# Patient Record
Sex: Female | Born: 1985 | Race: Black or African American | Hispanic: No | Marital: Single | Smoking: Former smoker
Health system: Southern US, Community
[De-identification: ages and names within clinical notes are randomized; demographics above are authoritative.]

## PROBLEM LIST (undated history)

## (undated) DIAGNOSIS — I10 Essential (primary) hypertension: Secondary | ICD-10-CM

## (undated) HISTORY — DX: Essential (primary) hypertension: I10

---
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
## 2004-01-24 ENCOUNTER — Emergency Department (HOSPITAL_COMMUNITY): Admission: EM | Admit: 2004-01-24 | Discharge: 2004-01-24 | Payer: Self-pay | Admitting: Emergency Medicine

## 2004-02-03 ENCOUNTER — Ambulatory Visit: Payer: Self-pay

## 2004-03-24 ENCOUNTER — Emergency Department (HOSPITAL_COMMUNITY): Admission: EM | Admit: 2004-03-24 | Discharge: 2004-03-24 | Payer: Self-pay | Admitting: Emergency Medicine

## 2004-04-17 ENCOUNTER — Encounter (INDEPENDENT_AMBULATORY_CARE_PROVIDER_SITE_OTHER): Payer: Self-pay | Admitting: *Deleted

## 2004-04-17 LAB — CONVERTED CEMR LAB

## 2004-05-12 ENCOUNTER — Ambulatory Visit: Payer: Self-pay | Admitting: Family Medicine

## 2004-09-09 ENCOUNTER — Ambulatory Visit: Payer: Self-pay | Admitting: Family Medicine

## 2004-12-17 ENCOUNTER — Ambulatory Visit: Payer: Self-pay | Admitting: Family Medicine

## 2005-05-11 ENCOUNTER — Ambulatory Visit: Payer: Self-pay | Admitting: Family Medicine

## 2006-03-16 DIAGNOSIS — E669 Obesity, unspecified: Secondary | ICD-10-CM | POA: Insufficient documentation

## 2006-03-16 DIAGNOSIS — L732 Hidradenitis suppurativa: Secondary | ICD-10-CM | POA: Insufficient documentation

## 2006-03-16 DIAGNOSIS — F17200 Nicotine dependence, unspecified, uncomplicated: Secondary | ICD-10-CM | POA: Insufficient documentation

## 2006-03-17 ENCOUNTER — Encounter (INDEPENDENT_AMBULATORY_CARE_PROVIDER_SITE_OTHER): Payer: Self-pay | Admitting: *Deleted

## 2006-08-08 IMAGING — CR DG FINGER INDEX 2+V*L*
1 series · 1 of 1 positions shown · non-contrast
Comparison: None.

CLINICAL DATA: Laceration to distal finger.
 LEFT SECOND FINGER:

[view not recorded]
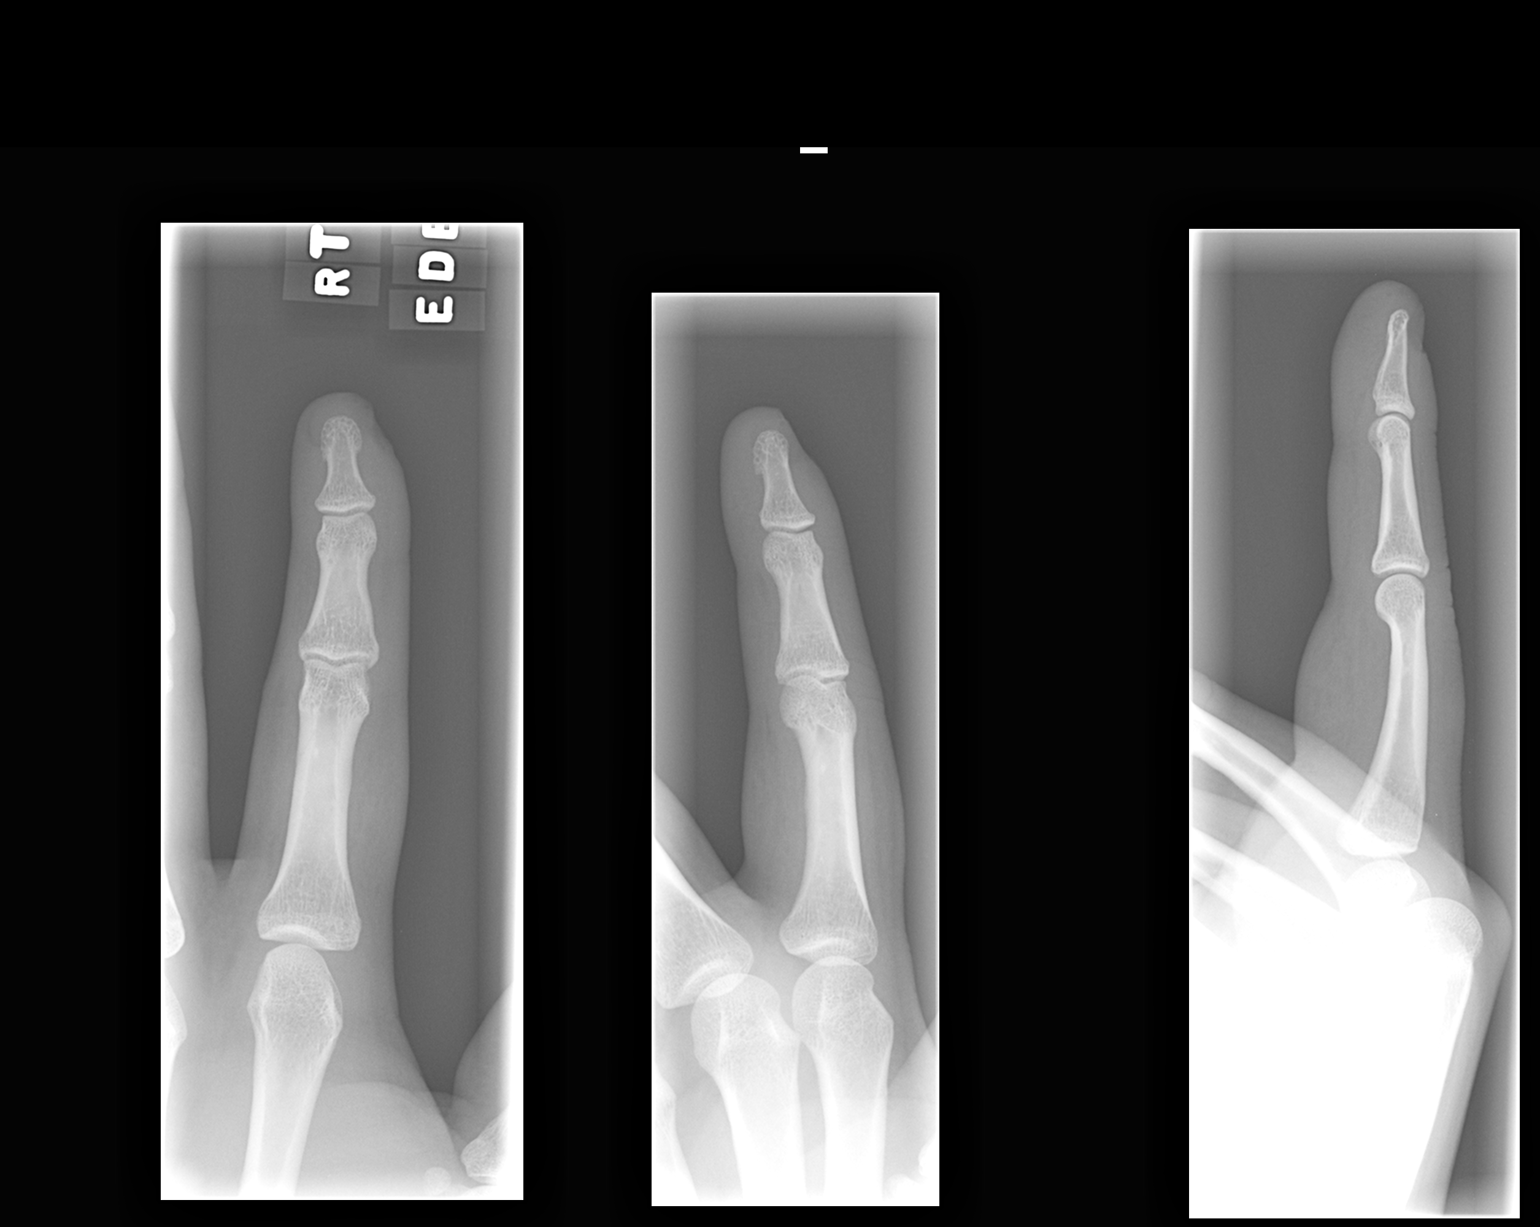

[1 of 1 positions shown; findings below may reference images not displayed]

Three view exam shows a soft tissue defect of the distal digit, but underlying bony structures intact.
IMPRESSION: Soft tissue defect ? bony structures intact.

## 2007-06-05 ENCOUNTER — Emergency Department (HOSPITAL_COMMUNITY): Admission: EM | Admit: 2007-06-05 | Discharge: 2007-06-05 | Payer: Self-pay | Admitting: Emergency Medicine

## 2010-06-01 ENCOUNTER — Encounter: Payer: Self-pay | Admitting: Family Medicine

## 2010-06-01 ENCOUNTER — Ambulatory Visit (INDEPENDENT_AMBULATORY_CARE_PROVIDER_SITE_OTHER): Payer: Self-pay | Admitting: Family Medicine

## 2010-06-01 VITALS — BP 144/95 | HR 75 | Temp 97.7°F | Ht 71.5 in | Wt 328.0 lb

## 2010-06-01 DIAGNOSIS — I10 Essential (primary) hypertension: Secondary | ICD-10-CM | POA: Insufficient documentation

## 2010-06-01 DIAGNOSIS — E669 Obesity, unspecified: Secondary | ICD-10-CM

## 2010-06-01 LAB — LDL CHOLESTEROL, DIRECT: Direct LDL: 69 mg/dL

## 2010-06-01 LAB — POCT GLYCOSYLATED HEMOGLOBIN (HGB A1C): Hemoglobin A1C: 5.9

## 2010-06-01 MED ORDER — HYDROCHLOROTHIAZIDE 25 MG PO TABS: 25.0000 mg | ORAL_TABLET | Freq: Every day | ORAL | Status: DC

## 2010-06-01 NOTE — Assessment & Plan Note (Addendum)
Body mass index is 45.11 kg/(m^2).This is deteriorated. Discussed principles of DASH diet and encouraged physical activity. Patient recently began paying attention to her diet. Hopeful for weight loss. Will screen for HLD, DM, TSH today.

## 2010-06-01 NOTE — Assessment & Plan Note (Signed)
Will check CBC, CMP today to evaluate for systemic causes. Pt started lifestyle modifications last week. Will continue to encourage DASH diet and regular exercise. Starting HCTZ. F/u in one week for BP check. Goal <140/90.

## 2010-06-01 NOTE — Progress Notes (Signed)
  Subjective:    Patient ID: Nancy Jarvis, female    DOB: January 24, 1985, 25 y.o.   MRN: [id number]  HPI 1. Blood pressure. Problem is self-identified. Has been feeling tired, malaise, headaches recently. Family told her to check BP and discovered to be high in 140s/90s consistently. Has been trying to cut down on her eating, not much success with weight so far. Mother and father both have HTN.   2. Hx of MRSA. Had boils previously and treated with antibiotics. Pt is worried about being tested for this; however, no boil or infected skin currently.  3. Health maintenance. Behind on pap screening. Has not seen a doctor in many years.   Review of Systems See HPI. Endorses headaches, seeing spots at times, fatigue. Denies abnormal menstrual bleeding, change in urination.     Objective:   Physical Exam  Vitals reviewed. Constitutional: She is oriented to person, place, and time. She appears well-developed and well-nourished. No distress.  HENT:  Head: Normocephalic and atraumatic.  Eyes: Conjunctivae and EOM are normal. Pupils are equal, round, and reactive to light.       Fundoscopic exam negative  Cardiovascular: Normal rate, regular rhythm, normal heart sounds and intact distal pulses.   No murmur heard. Pulmonary/Chest: Effort normal and breath sounds normal. No respiratory distress.  Musculoskeletal: She exhibits no edema.  Neurological: She is alert and oriented to person, place, and time.  Skin: No rash noted.  Psychiatric: She has a normal mood and affect.          Assessment & Plan:

## 2010-06-01 NOTE — Patient Instructions (Addendum)
Nice to meet you. Look up the DASH diet. I will call you if your labs are abnormal. Please call to schedule a BP check with nurse in next one week. You may schedule a visit for Pap at your convenience.  Hypertension (High Blood Pressure) As your heart beats, it forces blood through your arteries. This force is your blood pressure. If the pressure is too high, it is called hypertension (HTN) or high blood pressure. HTN is dangerous because you may have it and not know it. High blood pressure may mean that your heart has to work harder to pump blood. Your arteries may be narrow or stiff. The extra work puts you at risk for heart disease, stroke, and other problems.  Blood pressure consists of two numbers, a higher number over a lower, 110/72, for example. It is stated as "110 over 72." The ideal is below 120 for the top number (systolic) and under 80 for the bottom (diastolic). Write down your blood pressure today. You should pay close attention to your blood pressure if you have certain conditions such as:  Heart failure.  Prior heart attack.   Diabetes   Chronic kidney disease.   Prior stroke.   Multiple risk factors for heart disease.   To see if you have HTN, your blood pressure should be measured while you are seated with your arm held at the level of the heart. It should be measured at least twice. A one-time elevated blood pressure reading (especially in the Emergency Department) does not mean that you need treatment. There may be conditions in which the blood pressure is different between your right and left arms. It is important to see your caregiver soon for a recheck. Most people have essential hypertension which means that there is not a specific cause. This type of high blood pressure may be lowered by changing lifestyle factors such as:  Stress.  Smoking.   Lack of exercise.   Excessive weight.  Drug/tobacco/alcohol use.   Eating less salt.   Most people do not have  symptoms from high blood pressure until it has caused damage to the body. Effective treatment can often prevent, delay or reduce that damage. TREATMENT Treatment for high blood pressure, when a cause has been identified, is directed at the cause. There are a large number of medications to treat HTN. These fall into several categories, and your caregiver will help you select the medicines that are best for you. Medications may have side effects. You should review side effects with your caregiver. If your blood pressure stays high after you have made lifestyle changes or started on medicines,   Your medication(s) may need to be changed.   Other problems may need to be addressed.   Be certain you understand your prescriptions, and know how and when to take your medicine.   Be sure to follow up with your caregiver within the time frame advised (usually within two weeks) to have your blood pressure rechecked and to review your medications.   If you are taking more than one medicine to lower your blood pressure, make sure you know how and at what times they should be taken. Taking two medicines at the same time can result in blood pressure that is too low.  SEEK IMMEDIATE MEDICAL CARE IF YOU DEVELOP:  A severe headache, blurred or changing vision, or confusion.   Unusual weakness or numbness, or a faint feeling.   Severe chest or abdominal pain, vomiting, or breathing problems.  MAKE SURE YOU:   Understand these instructions.   Will watch your condition.   Will get help right away if you are not doing well or get worse.  Document Released: 01/03/2005 Document Re-Released: 06/23/2009 [facility] Patient Information 2011 [place], [place].

## 2010-06-02 LAB — COMPREHENSIVE METABOLIC PANEL
ALT: 14 U/L (ref 0–35)
AST: 14 U/L (ref 0–37)
Albumin: 4.3 g/dL (ref 3.5–5.2)
BUN: 11 mg/dL (ref 6–23)
Calcium: 9.5 mg/dL (ref 8.4–10.5)
Chloride: 105 mEq/L (ref 96–112)
Potassium: 4.4 mEq/L (ref 3.5–5.3)

## 2010-06-02 LAB — CBC
HCT: 45.2 % (ref 36.0–46.0)
Hemoglobin: 14.4 g/dL (ref 12.0–15.0)
MCV: 93.4 fL (ref 78.0–100.0)
WBC: 7.7 10*3/uL (ref 4.0–10.5)

## 2010-06-10 ENCOUNTER — Encounter: Payer: Self-pay | Admitting: Family Medicine

## 2010-07-02 ENCOUNTER — Encounter: Payer: Self-pay | Admitting: Family Medicine

## 2011-02-17 ENCOUNTER — Ambulatory Visit (INDEPENDENT_AMBULATORY_CARE_PROVIDER_SITE_OTHER): Payer: Self-pay | Admitting: Family Medicine

## 2011-02-17 ENCOUNTER — Ambulatory Visit: Payer: Self-pay

## 2011-02-17 ENCOUNTER — Encounter: Payer: Self-pay | Admitting: Family Medicine

## 2011-02-17 DIAGNOSIS — M25579 Pain in unspecified ankle and joints of unspecified foot: Secondary | ICD-10-CM

## 2011-02-17 DIAGNOSIS — I10 Essential (primary) hypertension: Secondary | ICD-10-CM

## 2011-02-17 DIAGNOSIS — M25572 Pain in left ankle and joints of left foot: Secondary | ICD-10-CM

## 2011-02-17 MED ORDER — MELOXICAM 15 MG PO TABS: 15.0000 mg | ORAL_TABLET | Freq: Every day | ORAL | Status: DC

## 2011-02-17 NOTE — Patient Instructions (Signed)
Make appointment sports medicine for ankle pain
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
## 2011-02-17 NOTE — Assessment & Plan Note (Signed)
Chronic left lateral ankle pain, likely strain vs tendinopathy.  Has not improved with conservative tx in 2 month.  Will refer to sports med for further evaluation.

## 2011-02-17 NOTE — Assessment & Plan Note (Signed)
Advised to restart HCTZ

## 2011-02-22 ENCOUNTER — Ambulatory Visit (INDEPENDENT_AMBULATORY_CARE_PROVIDER_SITE_OTHER): Payer: Self-pay | Admitting: Sports Medicine

## 2011-02-22 ENCOUNTER — Encounter: Payer: Self-pay | Admitting: Sports Medicine

## 2011-02-22 VITALS — BP 125/90 | HR 87 | Ht 71.0 in | Wt 320.0 lb

## 2011-02-22 DIAGNOSIS — M25579 Pain in unspecified ankle and joints of unspecified foot: Secondary | ICD-10-CM

## 2011-02-22 DIAGNOSIS — M7750 Other enthesopathy of unspecified foot: Secondary | ICD-10-CM

## 2011-02-22 MED ORDER — MELOXICAM 15 MG PO TABS: ORAL_TABLET | ORAL | Status: DC
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
## 2011-02-22 NOTE — Patient Instructions (Signed)
Great to see you Nancy Jarvis,  See you in 4 weeks.   Nancy Jarvis. Nancy Jarvis, M.D. [facility] [address], [place] [address] [phone]

## 2011-02-22 NOTE — Assessment & Plan Note (Signed)
Meloxicam. Sports insoles. Rehab. Supportive footwear. RTC 4 weeks.

## 2011-03-22 ENCOUNTER — Ambulatory Visit (INDEPENDENT_AMBULATORY_CARE_PROVIDER_SITE_OTHER): Payer: Self-pay | Admitting: Sports Medicine

## 2011-03-22 VITALS — BP 130/84

## 2011-03-22 DIAGNOSIS — M25572 Pain in left ankle and joints of left foot: Secondary | ICD-10-CM

## 2011-03-22 DIAGNOSIS — M25579 Pain in unspecified ankle and joints of unspecified foot: Secondary | ICD-10-CM

## 2011-03-22 NOTE — Assessment & Plan Note (Signed)
Initially suspected STS. Now appears more peroneal tendonitis. US guided injection as above. Cont meloxicam and ankle rehab.  RTC 3 weeks.

## 2011-03-22 NOTE — Patient Instructions (Signed)
Pt seen in office today, please excuse from any absences today. Nancy Jarvis. Benjamin Stain, M.D.

## 2011-03-22 NOTE — Progress Notes (Signed)
  Subjective:    Patient ID: Nancy Jarvis, female    DOB: [birth year], 26 y.o.   MRN: [id number]  HPI I saw this patient a few weeks ago for left ankle pain. At that point I gave her a presumptive diagnosis of sinus tarsi syndrome. 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 She localizes her pain behind the lateral malleolus, around the malleolus, and into the dorsolateral foot. Pain is worse with ambulating, and radiates as above.   Review of Systems    No fevers, chills, night sweats, weight loss, chest pain, or shortness of breath.  Social History: Non-smoker. Objective:   Physical Exam General:  Well developed, well nourished, and in no acute distress. Neuro:  Alert and oriented x3, extra-ocular muscles intact. Skin: Warm and dry, no rashes noted. Respiratory:  Not using accessory muscles, speaking in full sentences. Musculoskeletal: Ankle: No visible erythema or swelling. Range of motion is full in all directions. Strength is 5/5 in all directions. Stable lateral and medial ligaments; squeeze test and kleiger test unremarkable; Talar dome nontender; No pain at base of 5th MT; No tenderness over cuboid; No tenderness over N spot or navicular prominence Tender to palpation around the posterior and inferior aspects of the lateral malleolus. Some fullness noted over the peroneal tendons. Pain with resisted eversion. No sign of peroneal tendon subluxations or tenderness to palpation Negative tarsal tunnel tinel's Able to walk 4 steps.  Real-time Ultrasound Guided Injection of: Left peroneal tendon sheath Ultrasound guided injection is preferred based studies that show increased duration, increased effect, greater accuracy, decreased procedural pain, increased response rate, and decreased cost with ultrasound guided versus blind  injection. Consent obtained. Time-out conducted. Noted no overlying erythema, induration, or other signs of local infection. Skin prepped in a sterile fashion. Local anesthesia: Topical ethyl chloride. With sterile technique and under real time ultrasound guidance: Needle inserted into sheath, 1 cc lidocaine, 1 cc Depo-Medrol injected into sheath. Completed without difficulty Pain immediately resolved suggesting accurate placement of the medication. Advised to call if fevers/chills, erythema, induration, drainage, or persistent bleeding. Images saved.     Assessment & Plan:

## 2011-03-31 ENCOUNTER — Encounter: Payer: Self-pay | Admitting: *Deleted

## 2011-03-31 ENCOUNTER — Telehealth: Payer: Self-pay | Admitting: *Deleted

## 2011-03-31 NOTE — Telephone Encounter (Signed)
Message copied by Mora Bellman on Thu Mar 31, 2011  9:01 AM ------      Message from: Monica Becton      Created: Wed Mar 30, 2011  7:44 PM      Regarding: RE: work note?      Contact: [phone]       Hi Kadir Azucena!            Yah I think that'd be fine for her note.  She doing any better s/p injection? CU tomo!      -T            ----- Message -----         From: Mora Bellman, RN         Sent: 03/30/2011   2:37 PM           To: Monica Becton, MD      Subject: Annell Greening: work note?                                           Is it ok to do this note?      ----- Message -----         From: Lizbeth Bark         Sent: 03/30/2011  12:14 PM           To: Mora Bellman, RN      Subject: work note?                                               Pt is stating that she needs a note for work saying that she can't be on her feet more than 5 hours.  Any questions call her before 5 pm. [phone]

## 2011-03-31 NOTE — Telephone Encounter (Signed)
Work note completed and faxed.  Pt notified.

## 2011-04-12 ENCOUNTER — Encounter: Payer: Self-pay | Admitting: Family Medicine

## 2011-04-19 ENCOUNTER — Ambulatory Visit (INDEPENDENT_AMBULATORY_CARE_PROVIDER_SITE_OTHER): Payer: Self-pay | Admitting: Sports Medicine

## 2011-04-19 VITALS — BP 120/80

## 2011-04-19 DIAGNOSIS — M25579 Pain in unspecified ankle and joints of unspecified foot: Secondary | ICD-10-CM

## 2011-04-19 DIAGNOSIS — M25572 Pain in left ankle and joints of left foot: Secondary | ICD-10-CM

## 2011-04-19 NOTE — Patient Instructions (Signed)
MRI ankle. Aircast. Work cut back to 3h.  Return to go over MRI results.  Dr. Karie Schwalbe.

## 2011-04-19 NOTE — Progress Notes (Signed)
  Subjective:    Patient ID: Nancy Jarvis, female    DOB: 1985/11/18, 26 y.o.   MRN: [id number]  HPI Nancy Jarvis comes back for followup of her left ankle pain, and carries a diagnosis of peroneal tendinopathy. 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 I put her through oral NSAIDs, rehabilitation exercises, correct her biomechanics, and have performed a peroneal tendon ultrasound guided injection at the last visit.overall she is only about 30% better, she notes decreased swelling, but still has pain. I cut back her work day to a maximum 5 hours on her feet, however she notes that she still gets pain when she works on her feet more than 3 hours.  Review of Systems    No fevers, chills, night sweats, weight loss, chest pain, or shortness of breath.  Social History: Non-smoker. Objective:   Physical Exam General:  Well developed, well nourished, and in no acute distress. Neuro:  Alert and oriented x3, extra-ocular muscles intact. Skin: Warm and dry, no rashes noted. Respiratory:  Not using accessory muscles, speaking in full sentences. Musculoskeletal: Left Ankle: No visible erythema or swelling. Range of motion is full in all directions. Strength is 5/5 in all directions. Stable lateral and medial ligaments; squeeze test and kleiger test unremarkable; Talar dome nontender; No pain at base of 5th MT; No tenderness over cuboid; No tenderness over N spot or navicular prominence Tender to palpation behind the lateral malleolus, and minimal pain with resisted eversion. Negative tarsal tunnel tinel's Able to walk 4 steps.       Assessment & Plan:

## 2011-04-19 NOTE — Assessment & Plan Note (Signed)
Better with injection. Not sufficiently better s/p correction of biomechanics, rehab, NSAIDS, injection. MRI R ankle. RTC to discuss results.

## 2011-04-25 ENCOUNTER — Encounter: Payer: Self-pay | Admitting: Family Medicine

## 2011-04-25 ENCOUNTER — Other Ambulatory Visit (HOSPITAL_COMMUNITY)
Admission: RE | Admit: 2011-04-25 | Discharge: 2011-04-25 | Disposition: A | Discharge status: Home / Self Care | Payer: Self-pay | Source: Ambulatory Visit | Attending: Family Medicine | Admitting: Family Medicine

## 2011-04-25 ENCOUNTER — Ambulatory Visit (INDEPENDENT_AMBULATORY_CARE_PROVIDER_SITE_OTHER): Payer: Self-pay | Admitting: Family Medicine

## 2011-04-25 VITALS — BP 131/93 | HR 70 | Temp 97.8°F | Ht 71.0 in | Wt 324.7 lb

## 2011-04-25 DIAGNOSIS — Z113 Encounter for screening for infections with a predominantly sexual mode of transmission: Secondary | ICD-10-CM | POA: Insufficient documentation

## 2011-04-25 DIAGNOSIS — I10 Essential (primary) hypertension: Secondary | ICD-10-CM

## 2011-04-25 DIAGNOSIS — Z124 Encounter for screening for malignant neoplasm of cervix: Secondary | ICD-10-CM

## 2011-04-25 DIAGNOSIS — Z23 Encounter for immunization: Secondary | ICD-10-CM

## 2011-04-25 DIAGNOSIS — Z202 Contact with and (suspected) exposure to infections with a predominantly sexual mode of transmission: Secondary | ICD-10-CM

## 2011-04-25 DIAGNOSIS — Z01419 Encounter for gynecological examination (general) (routine) without abnormal findings: Secondary | ICD-10-CM | POA: Insufficient documentation

## 2011-04-25 DIAGNOSIS — R51 Headache: Secondary | ICD-10-CM

## 2011-04-25 DIAGNOSIS — E669 Obesity, unspecified: Secondary | ICD-10-CM

## 2011-04-25 DIAGNOSIS — F17200 Nicotine dependence, unspecified, uncomplicated: Secondary | ICD-10-CM

## 2011-04-25 MED ORDER — LABETALOL HCL 100 MG PO TABS: 100.0000 mg | ORAL_TABLET | Freq: Two times a day (BID) | ORAL | Status: DC

## 2011-04-25 NOTE — Patient Instructions (Signed)
Will start labetolol 100mg  twice daily-low dose. This med is safe if you ever get pregnant. Goal BP is <135/85. Make an appointment for nurse blood pressure check in 1-2 weeks. Will call with test results if abnormal, otherwise will get a letter. Will make nutrition referral-please call Dr. Gerilyn Pilgrim for appointment when ready to schedule. Will make dental referral-likely on the waiting list.  DASH Diet The DASH diet stands for "Dietary Approaches to Stop Hypertension." It is a healthy eating plan that has been shown to reduce high blood pressure (hypertension) in as little as 14 days, while also possibly providing other significant health benefits. These other health benefits include reducing the risk of breast cancer after menopause and reducing the risk of type 2 diabetes, heart disease, colon cancer, and stroke. Health benefits also include weight loss and slowing kidney failure in patients with chronic kidney disease.  DIET GUIDELINES  Limit salt (sodium). Your diet should contain less than 1500 mg of sodium daily.   Limit refined or processed carbohydrates. Your diet should include mostly whole grains. Desserts and added sugars should be used sparingly.   Include small amounts of heart-healthy fats. These types of fats include nuts, oils, and tub margarine. Limit saturated and trans fats. These fats have been shown to be harmful in the body.  CHOOSING FOODS  The following food groups are based on a 2000 calorie diet. See your Registered Dietitian for individual calorie needs. Grains and Grain Products (6 to 8 servings daily)  Eat More Often: Whole-wheat bread, brown rice, whole-grain or wheat pasta, quinoa, popcorn without added fat or salt (air popped).   Eat Less Often: White bread, white pasta, white rice, cornbread.  Vegetables (4 to 5 servings daily)  Eat More Often: Fresh, frozen, and canned vegetables. Vegetables may be raw, steamed, roasted, or grilled with a minimal amount of fat.    Eat Less Often/Avoid: Creamed or fried vegetables. Vegetables in a cheese sauce.  Fruit (4 to 5 servings daily)  Eat More Often: All fresh, canned (in natural juice), or frozen fruits. Dried fruits without added sugar. One hundred percent fruit juice ( cup [237 mL] daily).   Eat Less Often: Dried fruits with added sugar. Canned fruit in light or heavy syrup.  [employer], Fish, and Poultry (2 servings or less daily. One serving is 3 to 4 oz [85-114 g]).  Eat More Often: Ninety percent or leaner ground beef, tenderloin, sirloin. Round cuts of beef, chicken breast, [place] breast. All fish. Grill, bake, or broil your meat. Nothing should be fried.   Eat Less Often/Avoid: Fatty cuts of meat, [place], or chicken leg, thigh, or wing. Fried cuts of meat or fish.  Dairy (2 to 3 servings)  Eat More Often: Low-fat or fat-free milk, low-fat plain or light yogurt, reduced-fat or part-skim cheese.   Eat Less Often/Avoid: Milk (whole, 2%, skim, or chocolate).Whole milk yogurt. Full-fat cheeses.  Nuts, Seeds, and Legumes (4 to 5 servings per week)  Eat More Often: All without added salt.   Eat Less Often/Avoid: Salted nuts and seeds, canned beans with added salt.  Fats and Sweets (limited)  Eat More Often: Vegetable oils, tub margarines without trans fats, sugar-free gelatin. Mayonnaise and salad dressings.   Eat Less Often/Avoid: Coconut oils, palm oils, butter, stick margarine, cream, half and half, cookies, candy, pie.  FOR MORE INFORMATION The Dash Diet Eating Plan: www.dashdiet.org Document Released: 12/23/2010 Document Reviewed: 12/13/2010 [facility] Patient Information 2012 [place], [place].

## 2011-04-26 DIAGNOSIS — R519 Headache, unspecified: Secondary | ICD-10-CM | POA: Insufficient documentation

## 2011-04-26 DIAGNOSIS — R51 Headache: Secondary | ICD-10-CM | POA: Insufficient documentation

## 2011-04-26 LAB — HIV ANTIBODY (ROUTINE TESTING W REFLEX): HIV: NONREACTIVE

## 2011-04-26 NOTE — Assessment & Plan Note (Addendum)
Diastolic above goal. Will start second agent labetolol at 100mg  BID, as patient may began pregnant not using contraception will avoid ACEi/ARBs. Follow up in 1-2 weeks for nurse visit. Nutrition referral. Patient with orange card now, will consider referral for sleep study if available to rule out secondary cause of OSA.

## 2011-04-26 NOTE — Assessment & Plan Note (Signed)
Patient attributes to HTN, but I am doubtful. Will follow up symptoms once BP controlled. More likely this is migraine disorder or sleep apnea induced.

## 2011-04-26 NOTE — Progress Notes (Signed)
  Subjective:    Patient ID: Nancy Jarvis, female    DOB: 09-28-1985, 28 y.o.   MRN: [id number]  HPI CPP today.  1. HTN. Taking HCTZ most days. No side effects. Still has headaches sometimes she attributes to HTN, but does not check at home. Denies edema, chest pain, syncope, urinary changes.  2. Tobacco abuse. Has decreased cigarette consumption to few daily. Is attempting cessation without assistance.   3. HM. No contraception currently. Is considering becoming pregnant, has a monogamous relationship currently. Refuses contraception as she would be agreeable to pregnancy currently. Regular menstrual cycles.   4. Tooth pain. Has previous crowns and now a right molar is chipped causing worsening pain. No fever, edema, redness, pus. Requests dental referral.  5. Still having ankle pain-followed by Dr. Karie Schwalbe in sports med clinic, awaiting MRI results. Takes mobic irregularly. Requests stronger pain meds as she is limited standing at work.  Review of Systems See HPI otherwise negative.    Objective:   Physical Exam  Vitals reviewed. Constitutional: She is oriented to person, place, and time. She appears well-developed and well-nourished. No distress.  HENT:  Head: Normocephalic and atraumatic.       Right lower tooth with amalgum crown, chipped and disintegrated tooth noted. No abscess, erythema, pus noted. No LAD.  Eyes: EOM are normal. Pupils are equal, round, and reactive to light.  Neck: Neck supple. No thyromegaly present.  Cardiovascular: Normal rate, regular rhythm, normal heart sounds and intact distal pulses.   No murmur heard. Pulmonary/Chest: Effort normal and breath sounds normal. No respiratory distress. She has no wheezes. She has no rales.  Abdominal: Soft.  Genitourinary: Vagina normal and uterus normal. No vaginal discharge found.  Musculoskeletal: She exhibits no edema and no tenderness.  Neurological: She is alert and oriented to person, place, and time. No cranial  nerve deficit. She exhibits normal muscle tone. Coordination normal.  Skin: No rash noted. She is not diaphoretic.  Psychiatric: She has a normal mood and affect.       Assessment & Plan:

## 2011-04-26 NOTE — Assessment & Plan Note (Signed)
Patient not interested in cessation materials. Emphasized importance, follow up prn.

## 2011-04-26 NOTE — Assessment & Plan Note (Signed)
Weight stable over past year. Discussed dietary changes. Nutrition referral. Follow up in 2-3 months.

## 2011-04-28 ENCOUNTER — Encounter: Payer: Self-pay | Admitting: Family Medicine

## 2011-05-02 ENCOUNTER — Telehealth: Payer: Self-pay | Admitting: Family Medicine

## 2011-05-02 ENCOUNTER — Other Ambulatory Visit: Payer: Self-pay | Admitting: Family Medicine

## 2011-05-02 DIAGNOSIS — M25579 Pain in unspecified ankle and joints of unspecified foot: Secondary | ICD-10-CM

## 2011-05-02 MED ORDER — TRAMADOL HCL 50 MG PO TABS: 50.0000 mg | ORAL_TABLET | Freq: Four times a day (QID) | ORAL | Status: AC | PRN

## 2011-05-02 MED ORDER — MELOXICAM 15 MG PO TABS: ORAL_TABLET | ORAL | Status: AC

## 2011-05-02 NOTE — Telephone Encounter (Signed)
Patient advised to call  Outpatient Pharmacy and ask them to contact [facility] to have rx transferred over.

## 2011-05-02 NOTE — Telephone Encounter (Signed)
Reviewed [facility] notes-tendinopathy is working diagnosis pending MRI results. Patient has requested narcotics but we already discussed me not prescribing opiates for chronic ankle pain. I will refill her NSAID prn and we may try tramadol for short course. F/u at [facility]. Please inform patient she may pick up tramadol at the pharmacy.

## 2011-05-02 NOTE — Telephone Encounter (Signed)
Patient needs the Rx for Ultram and Mobic to be sent to [facility] and she needs the [facility] to be her new pharmacy and [facility] to be taken out.

## 2011-05-02 NOTE — Telephone Encounter (Signed)
Pt is stating that she is in need of pain meds because of her being on her feet all day.  She had talked with Dr T and he said for her to call to tell her doctor that she needs this and she can't see him until 4/30.  He said that if she has any questions, to call [facility] to talk to nurse there. She needs this asap.   [facility]

## 2011-05-02 NOTE — Telephone Encounter (Signed)
Message left on voicemail that rx has been sent to pharmacy. Follow up at [facility].

## 2011-05-02 NOTE — Telephone Encounter (Signed)
Will send message to Dr. Cristal Ford.   Called and spoke with mother .  Patient can't take calls at work.  Advised will call back when MD has responded. Patient's cell # [phone].

## 2011-05-04 ENCOUNTER — Telehealth: Payer: Self-pay | Admitting: Family Medicine

## 2011-05-04 NOTE — Telephone Encounter (Addendum)
Want a referral to Dr. Alphonse Guild office with [facility].  Please call patient back with all appt details when completed

## 2011-05-04 NOTE — Telephone Encounter (Signed)
I have sent in tramadol to pharmacy. Patient did not answer my return call. I will be willing to send the tramadol prescription to a different pharmacy if needed. I do not see any recent note by Dr. Karie Schwalbe. Patient should be following up with sports med for ankle pain. Dr. Karie Schwalbe treats her ankle pain.

## 2011-05-04 NOTE — Telephone Encounter (Signed)
Can you help me complete this referral. For ankle/foot pain. Thanks.

## 2011-05-04 NOTE — Telephone Encounter (Signed)
Patient is calling because she saw Dr. Karie Schwalbe at [facility] Medicine and he asked for Dr. Cristal Ford to prescribe some pain medication.  The Mobic is not working for her.  She said that Dr. Karie Schwalbe said to call him if there is a problem.  She also wants a referral to Dr. Shana Chute Specialist.

## 2011-05-05 ENCOUNTER — Other Ambulatory Visit: Payer: Self-pay | Admitting: Family Medicine

## 2011-05-05 DIAGNOSIS — M25572 Pain in left ankle and joints of left foot: Secondary | ICD-10-CM

## 2011-05-05 NOTE — Telephone Encounter (Signed)
LVM for patient to call back to get some info on the podiatry referral she is requesting. She is wanting to be referral to Dr.Regal with the [facility]. I don't see and insurance other then the orange card on file and was wanting to know if she is still wanting to be referred there she will have to pay $200 dollars up front to be seen and then she will be responsible for the rest of price. If not we will have to send through [facility].

## 2011-05-05 NOTE — Telephone Encounter (Signed)
Pt called to speak with you. ?regarding referral for foot doctor.  Please call back to give status on this

## 2011-05-05 NOTE — Telephone Encounter (Signed)
Spoke with patient and gave her the information from below. She would like for referral to go through [facility] being that money is not available at this time. I will faxed this off today

## 2011-05-17 ENCOUNTER — Ambulatory Visit (INDEPENDENT_AMBULATORY_CARE_PROVIDER_SITE_OTHER): Payer: Self-pay | Admitting: Sports Medicine

## 2011-05-17 VITALS — BP 124/89

## 2011-05-17 DIAGNOSIS — M25572 Pain in left ankle and joints of left foot: Secondary | ICD-10-CM

## 2011-05-17 DIAGNOSIS — M25579 Pain in unspecified ankle and joints of unspecified foot: Secondary | ICD-10-CM

## 2011-05-17 NOTE — Progress Notes (Signed)
Patient ID: Nancy Jarvis, female   DOB: 10-18-1985, 26 y.o.   MRN: [id number]  Patient here for MRI results, however MRI has not yet been done. She will reschedule after MRI is done. No charge.

## 2011-05-18 ENCOUNTER — Encounter: Payer: Self-pay | Admitting: *Deleted

## 2011-05-18 ENCOUNTER — Ambulatory Visit (HOSPITAL_COMMUNITY)
Admission: RE | Admit: 2011-05-18 | Discharge: 2011-05-18 | Disposition: A | Discharge status: Home / Self Care | Payer: Self-pay | Source: Ambulatory Visit | Attending: Sports Medicine | Admitting: Sports Medicine

## 2011-05-18 DIAGNOSIS — M76829 Posterior tibial tendinitis, unspecified leg: Secondary | ICD-10-CM | POA: Insufficient documentation

## 2011-05-24 ENCOUNTER — Encounter: Payer: Self-pay | Admitting: Sports Medicine

## 2011-05-24 ENCOUNTER — Ambulatory Visit (INDEPENDENT_AMBULATORY_CARE_PROVIDER_SITE_OTHER): Payer: Self-pay | Admitting: Sports Medicine

## 2011-05-24 VITALS — BP 131/84 | HR 92

## 2011-05-24 DIAGNOSIS — M25572 Pain in left ankle and joints of left foot: Secondary | ICD-10-CM

## 2011-05-24 DIAGNOSIS — M25579 Pain in unspecified ankle and joints of unspecified foot: Secondary | ICD-10-CM

## 2011-05-24 NOTE — Assessment & Plan Note (Addendum)
US Guided injection of tib post sheath. Airsport brace, for immobilization. OOW for 2 weeks. Back to theraband exercises. RTC 4 weeks.

## 2011-05-24 NOTE — Progress Notes (Signed)
  Subjective:    Patient ID: Nancy Jarvis, female    DOB: 08-19-1985, 26 y.o.   MRN: [id number]  HPI Nancy Jarvis comes back for followup of her left ankle pain.  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 I put her through oral NSAIDs, rehabilitation exercises, correct her biomechanics, and have performed a peroneal tendon ultrasound guided injection.  Overall she was only about 30% better, she notes decreased swelling, but still has pain. I cut back her work day to a maximum 5 hours on her feet, however she notes that she still gets pain when she works on her feet more than 3 hours.  I recently obtain an MRI of her left ankle, which showed an accessory navicular, as well as tibialis posterior tendinitis.  Review of Systems    No fevers, chills, night sweats, weight loss, chest pain, or shortness of breath.  Social History: Non-smoker. Objective:   Physical Exam General:  Well developed, well nourished, and in no acute distress. Neuro:  Alert and oriented x3, extra-ocular muscles intact. Skin: Warm and dry, no rashes noted. Respiratory:  Not using accessory muscles, speaking in full sentences. Musculoskeletal: Tender to palpation along entirety of tibialis posterior tendon, as well as over the sinus tarsi. Pain with resisted inversion.  Real-time Ultrasound Guided Injection of: left tibialis posterior tendon sheath. Ultrasound guided injection is preferred based studies that show increased duration, increased effect, greater accuracy, decreased procedural pain, increased response rate, and decreased cost with ultrasound guided versus blind injection. Verbal informed consent obtained. Time-out conducted. Noted no overlying erythema, induration, or other signs of local infection. Skin prepped in a sterile fashion. Local anesthesia: Topical Ethyl chloride. With sterile technique and under real time ultrasound guidance: needle inserted into the tendon sheath, 1 cc  Depo-Medrol 40, 2 cc lidocaine injected into the sheath without resistance. Completed without difficulty Pain immediately resolved suggesting accurate placement of the medication. Advised to call if fevers/chills, erythema, induration, drainage, or persistent bleeding. Images saved.    Assessment & Plan:

## 2011-06-21 ENCOUNTER — Ambulatory Visit: Payer: Self-pay | Admitting: Sports Medicine

## 2011-07-12 ENCOUNTER — Ambulatory Visit (INDEPENDENT_AMBULATORY_CARE_PROVIDER_SITE_OTHER): Payer: Self-pay | Admitting: Sports Medicine

## 2011-07-12 ENCOUNTER — Other Ambulatory Visit: Payer: Self-pay | Admitting: Family Medicine

## 2011-07-12 VITALS — BP 131/87

## 2011-07-12 DIAGNOSIS — I10 Essential (primary) hypertension: Secondary | ICD-10-CM

## 2011-07-12 DIAGNOSIS — M25572 Pain in left ankle and joints of left foot: Secondary | ICD-10-CM

## 2011-07-12 DIAGNOSIS — M25579 Pain in unspecified ankle and joints of unspecified foot: Secondary | ICD-10-CM

## 2011-07-12 MED ORDER — LABETALOL HCL 100 MG PO TABS: 100.0000 mg | ORAL_TABLET | Freq: Two times a day (BID) | ORAL | Status: DC

## 2011-07-12 NOTE — Progress Notes (Signed)
Patient ID: Nancy Jarvis, female   DOB: 1985/05/14, 26 y.o.   MRN: [id number] Subjective:   [id number] left ankle pain.  HPI: Miho returns with her pain not much better in her left ankle. To recap, she presented approximately 4/2 months ago with symptoms suggestive of a peroneal tendinitis, she failed conservative treatment with rehabilitation and NSAIDs, and had an ultrasound guided injection. Subsequently she developed pain along the medial aspect of the ankle, symptoms suggestive of a tibialis posterior tendinitis. she again failed rehabilitation, and immobilization for this as well as anti-inflammatories,we follow this up with an MRI, that showed tibialis posterior tendinitis without any evidence of a navicular stress fracture, and had an ultrasound guided injection into the tibialis posterior tendon sheath. This provided her with approximately several days of benefit, however the pain returned.  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  Past medical history, surgical history, family history, social history, allergies, and medications reviewed from the medical record and no changes needed.  Review of Systems: No fevers, chills, night sweats, weight loss, chest pain, or shortness of breath.    Objective:  General:  Well Developed, well nourished, and in no acute distress. Neuro:  Alert and oriented x3, extra-ocular muscles intact. Skin: Warm and dry, no rashes noted. Respiratory:  Not using accessory muscles, speaking in full sentences. Musculoskeletal: Ankle: No visible erythema or swelling. Range of motion is full in all directions. Strength is 5/5 in all directions. Stable lateral and medial ligaments; squeeze test and kleiger test unremarkable; Talar dome nontender; No pain at base of 5th MT; No tenderness over cuboid; No tenderness over N spot or navicular prominence Tender to palpation along the posterior aspect of the medial malleolus, all the way to the navicular.  Pain with resisted inversion. No sign of peroneal tendon subluxations or tenderness to palpation Negative tarsal tunnel tinel's Able to walk 4 steps.  Good hindfoot varus with toe raises. Assessment & Plan:

## 2011-07-12 NOTE — Assessment & Plan Note (Addendum)
Refractory posterior tibialis tendonitis. Failed PT, NSAIDS, US guided injection, bracing. Referral to Orthopaedics for consideration of operative treatment. We'll start with [facility], but if unable to consider sending to [facility].

## 2011-08-23 ENCOUNTER — Other Ambulatory Visit: Payer: Self-pay | Admitting: Family Medicine

## 2011-08-23 DIAGNOSIS — I10 Essential (primary) hypertension: Secondary | ICD-10-CM

## 2011-08-23 MED ORDER — HYDROCHLOROTHIAZIDE 25 MG PO TABS: 25.0000 mg | ORAL_TABLET | Freq: Every day | ORAL | Status: DC

## 2011-10-04 ENCOUNTER — Ambulatory Visit: Payer: Self-pay | Admitting: Family Medicine

## 2011-10-05 ENCOUNTER — Encounter: Payer: Self-pay | Admitting: Family Medicine

## 2011-10-05 ENCOUNTER — Ambulatory Visit (INDEPENDENT_AMBULATORY_CARE_PROVIDER_SITE_OTHER): Payer: Self-pay | Admitting: Family Medicine

## 2011-10-05 VITALS — BP 123/85 | HR 72 | Temp 98.0°F | Ht 71.0 in | Wt 336.0 lb

## 2011-10-05 DIAGNOSIS — K029 Dental caries, unspecified: Secondary | ICD-10-CM | POA: Insufficient documentation

## 2011-10-05 NOTE — Progress Notes (Signed)
  Subjective:    Patient ID: Nancy Jarvis, female    DOB: 12-Apr-1985, 26 y.o.   MRN: [id number]  HPI  Aniyia comes in due to tooth pain and a hole in her tooth.  It is a bottom left molar, the second one from the back.  She has not seen a dentist in a while, but has had several fillings before.  She says there is some minor pain, but no puss, abscess, drainage.  She says she tries to be good about oral hygiene brushing teeth twice a day, flossing, using mouthwash.  No fevers/chills, nausea/vomiting.   Review of Systems See HPI    Objective:   Physical Exam BP 123/85  Pulse 72  Temp 98 F (36.7 C) (Oral)  Ht 5\' 11"  (1.803 m)  Wt 336 lb (152.409 kg)  BMI 46.86 kg/m2  LMP 09/28/2011 General appearance: alert, cooperative, no distress and morbidly obese Mouth: oral mucosa moist, no lesions.  Poor dentition with multiple cavity fillings, patient does appear to have some tooth decay around the filling in the left 2nd to last bottom molar, but there is no abscess or drainage.        Assessment & Plan:

## 2011-10-05 NOTE — Assessment & Plan Note (Signed)
Will refer to dentist, but did warn patient that it may be several months before she can get in with the orange card.  Also reviewed red flags (increased pain, abscess, drainage) for which to seek care.

## 2011-10-06 ENCOUNTER — Telehealth: Payer: Self-pay | Admitting: *Deleted

## 2011-10-06 NOTE — Telephone Encounter (Signed)
Referral to [facility] fax 10.1.13

## 2011-11-07 ENCOUNTER — Other Ambulatory Visit: Payer: Self-pay | Admitting: Family Medicine

## 2011-11-07 DIAGNOSIS — I10 Essential (primary) hypertension: Secondary | ICD-10-CM

## 2011-11-07 MED ORDER — HYDROCHLOROTHIAZIDE 25 MG PO TABS: 25.0000 mg | ORAL_TABLET | Freq: Every day | ORAL | Status: DC

## 2011-11-11 ENCOUNTER — Ambulatory Visit: Payer: Self-pay | Admitting: Family Medicine

## 2011-11-23 ENCOUNTER — Other Ambulatory Visit: Payer: Self-pay | Admitting: Family Medicine

## 2011-11-23 DIAGNOSIS — I10 Essential (primary) hypertension: Secondary | ICD-10-CM

## 2011-11-23 MED ORDER — LABETALOL HCL 100 MG PO TABS: 100.0000 mg | ORAL_TABLET | Freq: Two times a day (BID) | ORAL | Status: DC

## 2011-12-08 ENCOUNTER — Ambulatory Visit (INDEPENDENT_AMBULATORY_CARE_PROVIDER_SITE_OTHER): Payer: Self-pay | Admitting: Family Medicine

## 2011-12-08 ENCOUNTER — Encounter: Payer: Self-pay | Admitting: Family Medicine

## 2011-12-08 VITALS — BP 129/74 | HR 71 | Temp 97.8°F | Ht 71.0 in | Wt 315.3 lb

## 2011-12-08 DIAGNOSIS — F17200 Nicotine dependence, unspecified, uncomplicated: Secondary | ICD-10-CM

## 2011-12-08 DIAGNOSIS — I10 Essential (primary) hypertension: Secondary | ICD-10-CM

## 2011-12-08 DIAGNOSIS — E669 Obesity, unspecified: Secondary | ICD-10-CM

## 2011-12-08 DIAGNOSIS — M545 Low back pain: Secondary | ICD-10-CM | POA: Insufficient documentation

## 2011-12-08 LAB — CBC
MCH: 31.2 pg (ref 26.0–34.0)
MCHC: 34.2 g/dL (ref 30.0–36.0)
Platelets: 283 10*3/uL (ref 150–400)
RBC: 4.91 MIL/uL (ref 3.87–5.11)

## 2011-12-08 LAB — COMPREHENSIVE METABOLIC PANEL
AST: 15 U/L (ref 0–37)
Albumin: 4.3 g/dL (ref 3.5–5.2)
BUN: 10 mg/dL (ref 6–23)
CO2: 25 mEq/L (ref 19–32)
Calcium: 9.5 mg/dL (ref 8.4–10.5)
Chloride: 104 mEq/L (ref 96–112)
Glucose, Bld: 102 mg/dL — ABNORMAL HIGH (ref 70–99)
Potassium: 3.9 mEq/L (ref 3.5–5.3)

## 2011-12-08 MED ORDER — CYCLOBENZAPRINE HCL 5 MG PO TABS: 5.0000 mg | ORAL_TABLET | Freq: Three times a day (TID) | ORAL | Status: DC | PRN

## 2011-12-08 MED ORDER — LABETALOL HCL 100 MG PO TABS: 100.0000 mg | ORAL_TABLET | Freq: Two times a day (BID) | ORAL | Status: DC

## 2011-12-08 MED ORDER — HYDROCHLOROTHIAZIDE 25 MG PO TABS: 25.0000 mg | ORAL_TABLET | Freq: Every day | ORAL | Status: DC

## 2011-12-08 NOTE — Patient Instructions (Addendum)
Nice to see you. Glad you are making good changes. Continue with your hard work with diet and weight loss. I have refilled meds for one year. Make sure to have labs checked yearly.  DASH Diet The DASH diet stands for "Dietary Approaches to Stop Hypertension." It is a healthy eating plan that has been shown to reduce high blood pressure (hypertension) in as little as 14 days, while also possibly providing other significant health benefits. These other health benefits include reducing the risk of breast cancer after menopause and reducing the risk of type 2 diabetes, heart disease, colon cancer, and stroke. Health benefits also include weight loss and slowing kidney failure in patients with chronic kidney disease.  DIET GUIDELINES  Limit salt (sodium). Your diet should contain less than 1500 mg of sodium daily.  Limit refined or processed carbohydrates. Your diet should include mostly whole grains. Desserts and added sugars should be used sparingly.  Include small amounts of heart-healthy fats. These types of fats include nuts, oils, and tub margarine. Limit saturated and trans fats. These fats have been shown to be harmful in the body. CHOOSING FOODS  The following food groups are based on a 2000 calorie diet. See your Registered Dietitian for individual calorie needs. Grains and Grain Products (6 to 8 servings daily)  Eat More Often: Whole-wheat bread, brown rice, whole-grain or wheat pasta, quinoa, popcorn without added fat or salt (air popped).  Eat Less Often: White bread, white pasta, white rice, cornbread. Vegetables (4 to 5 servings daily)  Eat More Often: Fresh, frozen, and canned vegetables. Vegetables may be raw, steamed, roasted, or grilled with a minimal amount of fat.  Eat Less Often/Avoid: Creamed or fried vegetables. Vegetables in a cheese sauce. Fruit (4 to 5 servings daily)  Eat More Often: All fresh, canned (in natural juice), or frozen fruits. Dried fruits without added  sugar. One hundred percent fruit juice ( cup [237 mL] daily).  Eat Less Often: Dried fruits with added sugar. Canned fruit in light or heavy syrup. [employer], Fish, and Poultry (2 servings or less daily. One serving is 3 to 4 oz [85-114 g]).  Eat More Often: Ninety percent or leaner ground beef, tenderloin, sirloin. Round cuts of beef, chicken breast, [place] breast. All fish. Grill, bake, or broil your meat. Nothing should be fried.  Eat Less Often/Avoid: Fatty cuts of meat, [place], or chicken leg, thigh, or wing. Fried cuts of meat or fish. Dairy (2 to 3 servings)  Eat More Often: Low-fat or fat-free milk, low-fat plain or light yogurt, reduced-fat or part-skim cheese.  Eat Less Often/Avoid: Milk (whole, 2%).Whole milk yogurt. Full-fat cheeses. Nuts, Seeds, and Legumes (4 to 5 servings per week)  Eat More Often: All without added salt.  Eat Less Often/Avoid: Salted nuts and seeds, canned beans with added salt. Fats and Sweets (limited)  Eat More Often: Vegetable oils, tub margarines without trans fats, sugar-free gelatin. Mayonnaise and salad dressings.  Eat Less Often/Avoid: Coconut oils, palm oils, butter, stick margarine, cream, half and half, cookies, candy, pie. FOR MORE INFORMATION The Dash Diet Eating Plan: www.dashdiet.org Document Released: 12/23/2010 Document Revised: 03/28/2011 Document Reviewed: 12/23/2010 [facility] Patient Information 2013 [place], [place].

## 2011-12-09 ENCOUNTER — Encounter: Payer: Self-pay | Admitting: Family Medicine

## 2011-12-09 LAB — TSH: TSH: 0.763 u[IU]/mL (ref 0.350–4.500)

## 2011-12-12 ENCOUNTER — Encounter: Payer: Self-pay | Admitting: Family Medicine

## 2011-12-12 NOTE — Assessment & Plan Note (Signed)
MSK strain. Refilled mobic and flexeril to use for short term. F/u in not improved in next few weeks.

## 2011-12-12 NOTE — Assessment & Plan Note (Signed)
BP at goal. Check labs today. Encouraged additional weight loss. Refilled medications x 1 year. Encouraged smoking cessation. Consider sleep study if weight loss stalls and BP becomes elevated again, high suspicion for sleep apnea.

## 2011-12-12 NOTE — Progress Notes (Signed)
  Subjective:    Patient ID: Nancy Jarvis, female    DOB: 1985-08-18, 26 y.o.   MRN: [id number]  HPI  1. HTN. Improved recently. She is trying to lose weight and has succeeded with diet. Compliant with medications and denies side effects.   2. Obesity. Success with diet and portion control.  Wt Readings from Last 3 Encounters:  12/08/11 315 lb 4.8 oz (143.019 kg)  10/05/11 336 lb (152.409 kg)  04/25/11 324 lb 11.2 oz (147.283 kg)   3. Smoking. Trying to quit cold [place] and has cut down. Not interested in medications.  4. Low back pain. Feels spasm sensation in her left low back. Exacerbation of previous problem, rated as mild. Request refill of flexeril which worked in the past. She is standing at her job all day. Denies hematuria, dysuria, constipation, numbness, weakness, falls.   Review of Systems See HPI otherwise negative.  reports that she has been smoking.  She has never used smokeless tobacco.     Objective:   Physical Exam  Vitals reviewed. Constitutional: She is oriented to person, place, and time. She appears well-developed and well-nourished. No distress.  HENT:  Head: Normocephalic and atraumatic.  Mouth/Throat: Oropharynx is clear and moist. No oropharyngeal exudate.  Eyes: EOM are normal. Pupils are equal, round, and reactive to light.  Neck: Neck supple. No thyromegaly present.  Cardiovascular: Normal rate, regular rhythm and normal heart sounds.   No murmur heard. Pulmonary/Chest: Effort normal and breath sounds normal. No respiratory distress. She has no wheezes. She has no rales.  Abdominal: Soft.  Musculoskeletal: She exhibits no edema and no tenderness.       Hypertonicity and mild TTP in left lower paraspinal muscles. No bone TTP. Normal ambulation.  Neurological: She is alert and oriented to person, place, and time.  Skin: No rash noted. She is not diaphoretic.  Psychiatric: She has a normal mood and affect.       Assessment & Plan:

## 2011-12-12 NOTE — Assessment & Plan Note (Signed)
encouraged cessation and f/u if she desires any medication assistance. Provided with tips.

## 2011-12-12 NOTE — Assessment & Plan Note (Signed)
Improved with diet. Provided with DASH dietary recommendations.

## 2012-03-30 ENCOUNTER — Encounter: Payer: Self-pay | Admitting: Family Medicine

## 2012-03-30 ENCOUNTER — Ambulatory Visit (INDEPENDENT_AMBULATORY_CARE_PROVIDER_SITE_OTHER): Payer: BC Managed Care – PPO | Admitting: Family Medicine

## 2012-03-30 VITALS — BP 147/90 | HR 69 | Temp 98.1°F | Ht 71.0 in | Wt 310.0 lb

## 2012-03-30 MED ORDER — AMOXICILLIN-POT CLAVULANATE 875-125 MG PO TABS: 1.0000 | ORAL_TABLET | Freq: Two times a day (BID) | ORAL | Status: DC

## 2012-03-30 NOTE — Progress Notes (Signed)
  Subjective:    Patient ID: Nancy Jarvis, female    DOB: 1985/02/27, 27 y.o.   MRN: [id number]  URI     1. Sinus problems. Having purulent rhinorrhea, L>R facial pain, watery eyes, headache, nasal congestion, dry cough for 7 days. This is worsening. Tried OTC mucinex and delsym.   No fever, chills, wheezing, dyspnea, nausea, emesis, abdominal pain, neck pain, otalgia, ear discharge, rash, chest pain.  No longer smoking.  Review of Systems See HPI otherwise negative.  reports that she has been smoking.  She has never used smokeless tobacco.     Objective:   Physical Exam  Vitals reviewed. Constitutional: She appears well-nourished. No distress.  Hoarse. Sniffling.  HENT:  Head: Normocephalic and atraumatic.  Right Ear: External ear normal.  Left Ear: External ear normal.  Mouth/Throat: Oropharynx is clear and moist. No oropharyngeal exudate.  Darkening under eyes, tearing.  Left maxillary TTP. No LAD. Normal neck ROM. Nasal rhinorrhea  Eyes: EOM are normal.  Neck: Normal range of motion. Neck supple. No thyromegaly present.  Cardiovascular: Normal rate, regular rhythm and normal heart sounds.   Pulmonary/Chest: Effort normal and breath sounds normal. No respiratory distress. She has no wheezes. She has no rales.  Lymphadenopathy:    She has no cervical adenopathy.  Skin: No rash noted.        Assessment & Plan:

## 2012-03-30 NOTE — Patient Instructions (Signed)
You may have early sinus infection. Start taking augmentin if not better in 2 days. Keep using mucinex, dayquil for symptoms. Avoid decongestants for blood pressure.  Use nasal saline. Call if worsens or don't improve.   Sinusitis Sinusitis is redness, soreness, and swelling (inflammation) of the paranasal sinuses. Paranasal sinuses are air pockets within the bones of your face (beneath the eyes, the middle of the forehead, or above the eyes). In healthy paranasal sinuses, mucus is able to drain out, and air is able to circulate through them by way of your nose. However, when your paranasal sinuses are inflamed, mucus and air can become trapped. This can allow bacteria and other germs to grow and cause infection. Sinusitis can develop quickly and last only a short time (acute) or continue over a long period (chronic). Sinusitis that lasts for more than 12 weeks is considered chronic.  CAUSES  Causes of sinusitis include:  Allergies.  Structural abnormalities, such as displacement of the cartilage that separates your nostrils (deviated septum), which can decrease the air flow through your nose and sinuses and affect sinus drainage.  Functional abnormalities, such as when the small hairs (cilia) that line your sinuses and help remove mucus do not work properly or are not present. SYMPTOMS  Symptoms of acute and chronic sinusitis are the same. The primary symptoms are pain and pressure around the affected sinuses. Other symptoms include:  Upper toothache.  Earache.  Headache.  Bad breath.  Decreased sense of smell and taste.  A cough, which worsens when you are lying flat.  Fatigue.  Fever.  Thick drainage from your nose, which often is green and may contain pus (purulent).  Swelling and warmth over the affected sinuses. DIAGNOSIS  Your caregiver will perform a physical exam. During the exam, your caregiver may:  Look in your nose for signs of abnormal growths in your nostrils  (nasal polyps).  Tap over the affected sinus to check for signs of infection.  View the inside of your sinuses (endoscopy) with a special imaging device with a light attached (endoscope), which is inserted into your sinuses. If your caregiver suspects that you have chronic sinusitis, one or more of the following tests may be recommended:  Allergy tests.  Nasal culture A sample of mucus is taken from your nose and sent to a lab and screened for bacteria.  Nasal cytology A sample of mucus is taken from your nose and examined by your caregiver to determine if your sinusitis is related to an allergy. TREATMENT  Most cases of acute sinusitis are related to a viral infection and will resolve on their own within 10 days. Sometimes medicines are prescribed to help relieve symptoms (pain medicine, decongestants, nasal steroid sprays, or saline sprays).  However, for sinusitis related to a bacterial infection, your caregiver will prescribe antibiotic medicines. These are medicines that will help kill the bacteria causing the infection.  Rarely, sinusitis is caused by a fungal infection. In theses cases, your caregiver will prescribe antifungal medicine. For some cases of chronic sinusitis, surgery is needed. Generally, these are cases in which sinusitis recurs more than 3 times per year, despite other treatments. HOME CARE INSTRUCTIONS   Drink plenty of water. Water helps thin the mucus so your sinuses can drain more easily.  Use a humidifier.  Inhale steam 3 to 4 times a day (for example, sit in the bathroom with the shower running).  Apply a warm, moist washcloth to your face 3 to 4 times a day,  or as directed by your caregiver.  Use saline nasal sprays to help moisten and clean your sinuses.  Take over-the-counter or prescription medicines for pain, discomfort, or fever only as directed by your caregiver. SEEK IMMEDIATE MEDICAL CARE IF:  You have increasing pain or severe headaches.  You  have nausea, vomiting, or drowsiness.  You have swelling around your face.  You have vision problems.  You have a stiff neck.  You have difficulty breathing. MAKE SURE YOU:   Understand these instructions.  Will watch your condition.  Will get help right away if you are not doing well or get worse. Document Released: 01/03/2005 Document Revised: 03/28/2011 Document Reviewed: 01/18/2011 [facility] Patient Information 2013 [place].

## 2012-03-30 NOTE — Assessment & Plan Note (Addendum)
Could be viral vs early bacterial infection since she is worsening. Discussed continued conservative management for next 2-3 days and if not improving or worsening then start augmentin. Warned of antibiotic side effects. Continue mucinex, nasal saline. RTC if worsens of fails to improve.  Avoid decongestants with hypertension. Encouraged her smoking cessation (done recently).

## 2012-04-02 ENCOUNTER — Telehealth: Payer: Self-pay | Admitting: Family Medicine

## 2012-04-02 NOTE — Telephone Encounter (Signed)
Is asking for cough medicine - she was here last week and can't stop coughing  [facility]

## 2012-04-02 NOTE — Telephone Encounter (Signed)
Patient states she is getting worse. Coughing and expectorating lots of mucous. Hoarseness  now and coughing worsening. Has taken all kinds of OTC meds and not helping. Appointment scheduled tomorrow with PCP.

## 2012-04-03 ENCOUNTER — Ambulatory Visit: Payer: BC Managed Care – PPO | Admitting: Family Medicine

## 2012-05-03 ENCOUNTER — Other Ambulatory Visit: Payer: Self-pay | Admitting: Podiatry

## 2012-05-03 DIAGNOSIS — M79672 Pain in left foot: Secondary | ICD-10-CM

## 2012-05-10 ENCOUNTER — Other Ambulatory Visit: Payer: BC Managed Care – PPO

## 2012-05-17 ENCOUNTER — Ambulatory Visit
Admission: RE | Admit: 2012-05-17 | Discharge: 2012-05-17 | Disposition: A | Discharge status: Home / Self Care | Payer: BC Managed Care – PPO | Source: Ambulatory Visit | Attending: Podiatry | Admitting: Podiatry

## 2012-05-17 DIAGNOSIS — M79672 Pain in left foot: Secondary | ICD-10-CM

## 2012-10-22 ENCOUNTER — Other Ambulatory Visit: Payer: Self-pay | Admitting: *Deleted

## 2012-10-22 MED ORDER — HYDROCODONE-ACETAMINOPHEN 5-325 MG PO TABS: 1.0000 | ORAL_TABLET | Freq: Two times a day (BID) | ORAL | Status: DC | PRN

## 2012-10-22 NOTE — Telephone Encounter (Signed)
Pt request refill Vicodin, states back to work and feet are really hurting.  Dr Charlsie Merles ordered Vicodin 5/325mg  #30 sig one tablet q 12h prn pain no refills.  I informed pt of refill and change in dosage and instructions.  Pt agreed and states her mtr will pick up.

## 2012-10-22 NOTE — Addendum Note (Signed)
Addended by: Alphia Kava D on: 10/22/2012 04:35 PM   Modules accepted: Orders

## 2012-11-19 ENCOUNTER — Telehealth: Payer: Self-pay | Admitting: *Deleted

## 2012-11-19 DIAGNOSIS — I10 Essential (primary) hypertension: Secondary | ICD-10-CM

## 2012-11-19 MED ORDER — HYDROCHLOROTHIAZIDE 25 MG PO TABS: 25.0000 mg | ORAL_TABLET | Freq: Every day | ORAL | Status: AC

## 2012-11-19 NOTE — Telephone Encounter (Signed)
Prescription sent electronically

## 2012-11-19 NOTE — Telephone Encounter (Signed)
Pharmacy calling asking for refill of HCTZ for patient, will forward to PCP.

## 2012-11-20 ENCOUNTER — Other Ambulatory Visit: Payer: Self-pay | Admitting: Family Medicine

## 2013-05-08 ENCOUNTER — Ambulatory Visit: Payer: No Typology Code available for payment source | Attending: Internal Medicine

## 2013-10-04 ENCOUNTER — Ambulatory Visit: Payer: No Typology Code available for payment source | Admitting: Family Medicine

## 2013-10-14 ENCOUNTER — Ambulatory Visit (INDEPENDENT_AMBULATORY_CARE_PROVIDER_SITE_OTHER): Payer: No Typology Code available for payment source | Admitting: Family Medicine

## 2013-10-14 ENCOUNTER — Encounter: Payer: Self-pay | Admitting: Family Medicine

## 2013-10-14 VITALS — BP 123/84 | Ht 72.0 in | Wt 325.0 lb

## 2013-10-14 DIAGNOSIS — M722 Plantar fascial fibromatosis: Secondary | ICD-10-CM

## 2013-10-14 MED ORDER — METHYLPREDNISOLONE ACETATE 40 MG/ML IJ SUSP
40.0000 mg | Freq: Once | INTRAMUSCULAR | Status: AC
  Administered 2013-10-14: 40 mg via INTRA_ARTICULAR

## 2013-10-21 DIAGNOSIS — M722 Plantar fascial fibromatosis: Secondary | ICD-10-CM | POA: Insufficient documentation

## 2013-10-21 NOTE — Assessment & Plan Note (Signed)
She does not have for orthotics with her but says she's been wearing them regularly. We discussed options which are few. I would recommend she return to the orthotics 100% of the time. She decides to pursue corticosteroid injection today which we did. She's aware that these are not to be repeated frequently and that she should wear orthotics more as a means of fixing a mechanical problem. Hopefully the injection will give her some immediate relief to get her over this rough spot

## 2013-10-21 NOTE — Progress Notes (Signed)
   Subjective:    Patient ID: Nancy DonningEbony L Henion, female    DOB: 05/25/1985, 28 y.o.   MRN: [id number]  HPI Left ankle and foot pain. 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 That helps some of her pain. She was actually doing well until she started back working full-time and going to school. She is working as a Investment banker, operationalchef and studying: [employer] so she is on her feet 12 are more hours a day. She started to have left ankle pain again as well as some plantar fascial pain. She recently had orthotics made by the podiatrist but does not seem much.   Review of Systems No unusual weight change. No fever, sweats, chills.    Objective:   Physical Exam  Vital signs are reviewed GENERAL: Well-developed overweight female no acute distress FOOT: Left. Significant congenital pes planus. She has a well-healed scar over the navicular area. Posterior tibial tendon is mildly tender to palpation. Origin the plantar fascia is exquisitely tender to palpation Her right foot also has similar congenital pes planus but is nontender to palpation.  INJECTION: Patient was given informed consent, signed copy in the chart. Appropriate time out was taken. Area prepped and draped in usual sterile fashion. One cc of methylprednisolone 40 mg/ml plus  one cc of 1% lidocaine without epinephrine was injected into the area above the plantar fascia on the left foot using a(n) plantar medial approach. The patient tolerated the procedure well. There were no complications. Post procedure instructions were given.      Assessment & Plan:

## 2014-04-21 ENCOUNTER — Encounter: Payer: Self-pay | Admitting: Podiatry

## 2014-04-21 ENCOUNTER — Ambulatory Visit (INDEPENDENT_AMBULATORY_CARE_PROVIDER_SITE_OTHER): Payer: BLUE CROSS/BLUE SHIELD

## 2014-04-21 ENCOUNTER — Ambulatory Visit (INDEPENDENT_AMBULATORY_CARE_PROVIDER_SITE_OTHER): Payer: BLUE CROSS/BLUE SHIELD | Admitting: Podiatry

## 2014-04-21 VITALS — BP 132/83 | HR 72 | Resp 14 | Ht 73.0 in | Wt 320.0 lb

## 2014-04-21 DIAGNOSIS — M79672 Pain in left foot: Secondary | ICD-10-CM | POA: Diagnosis not present

## 2014-04-21 DIAGNOSIS — M722 Plantar fascial fibromatosis: Secondary | ICD-10-CM

## 2014-04-21 MED ORDER — DICLOFENAC SODIUM 75 MG PO TBEC: 75.0000 mg | DELAYED_RELEASE_TABLET | Freq: Two times a day (BID) | ORAL | Status: DC

## 2014-04-21 MED ORDER — TRIAMCINOLONE ACETONIDE 10 MG/ML IJ SUSP
10.0000 mg | Freq: Once | INTRAMUSCULAR | Status: AC
  Administered 2014-04-21: 10 mg

## 2014-04-21 NOTE — Patient Instructions (Signed)

## 2014-04-21 NOTE — Progress Notes (Signed)
Subjective:     Patient ID: Nancy DonningEbony L Jarvis, female   DOB: 02/10/1985, 29 y.o.   MRN: [id number]  HPI patient states I'm having a lot of pain in my left heel that's been present for several months. She had had previous tendon repair done 3 or 4 years ago which is done well but she does have moderate to severe flatfoot and obesity which are complicating factors   Review of Systems  All other systems reviewed and are negative.      Objective:   Physical Exam  Constitutional: She is oriented to person, place, and time.  Cardiovascular: Intact distal pulses.   Musculoskeletal: Normal range of motion.  Neurological: She is oriented to person, place, and time.  Skin: Skin is warm.  Nursing note and vitals reviewed.  neurovascular status intact with muscle strength adequate range of motion subtalar midtarsal joint within normal limits. Patient's noted to have severe flatfoot deformity left and right with exquisite inflammation around the insertion of the plantar fascia into the left heel. Well-healed scar around the posterior tibial as it inserts into the navicular no other significant pathology noted      Assessment:     Acute plantar fasciitis left foot secondary to foot structure and obesity and activity levels    Plan:     H&P and x-ray reviewed. Injected the left plantar fascia 3 mg Kenalog 5 mg Xylocaine and applied fascial brace with instructions. Placed on oral anti-inflammatories and discussed long-term orthotics which we'll review at next visit

## 2014-04-21 NOTE — Progress Notes (Signed)
   Subjective:    Patient ID: Murvin DonningEbony L Lizer, female    DOB: 01/02/1986, 29 y.o.   MRN: [id number]  HPI Comments: Pt complains of sharp pain and occasional throbbing in the left heel and arch constantly and worsens after resting.  Pt states it has been going of for 1 month.     Review of Systems  Respiratory:       Pt states she woke this am with snuffy nose.  All other systems reviewed and are negative.      Objective:   Physical Exam        Assessment & Plan:

## 2014-04-28 ENCOUNTER — Ambulatory Visit (INDEPENDENT_AMBULATORY_CARE_PROVIDER_SITE_OTHER): Payer: BLUE CROSS/BLUE SHIELD | Admitting: Podiatry

## 2014-04-28 ENCOUNTER — Encounter: Payer: Self-pay | Admitting: Podiatry

## 2014-04-28 VITALS — BP 148/98 | HR 77 | Resp 15

## 2014-04-28 DIAGNOSIS — M722 Plantar fascial fibromatosis: Secondary | ICD-10-CM

## 2014-04-28 DIAGNOSIS — M79672 Pain in left foot: Secondary | ICD-10-CM

## 2014-04-28 MED ORDER — TRIAMCINOLONE ACETONIDE 10 MG/ML IJ SUSP
10.0000 mg | Freq: Once | INTRAMUSCULAR | Status: AC
  Administered 2014-04-28: 10 mg

## 2014-04-28 NOTE — Progress Notes (Signed)
Subjective:     Patient ID: Nancy DonningEbony L Jarvis, female   DOB: 11/10/1985, 29 y.o.   MRN: [id number]  HPI patient presents with continued pain in the medial heel left stating it is about 20% better. She does have significant flatfoot deformity bilateral that's contributory   Review of Systems     Objective:   Physical Exam Neurovascular status intact muscle strength adequate with significant depression of the arch bilateral with pain to palpation left plantar heel at the insertional point of the tendon into the calcaneus    Assessment:     Plantar fasciitis left with inflammation at the insertion with foot structural issues    Plan:     Reinjected the plantar fascia 3 Milligan Kenalog 5 mill grams Xylocaine and dispensed night splint with instructions. Went ahead today and scanned for custom orthotics to reduce plantar pressures against the feet

## 2014-05-19 ENCOUNTER — Ambulatory Visit: Payer: BLUE CROSS/BLUE SHIELD | Admitting: Podiatry

## 2014-06-24 ENCOUNTER — Ambulatory Visit: Payer: BLUE CROSS/BLUE SHIELD | Admitting: Podiatry

## 2014-10-07 ENCOUNTER — Emergency Department (HOSPITAL_COMMUNITY)
Admission: EM | Admit: 2014-10-07 | Discharge: 2014-10-07 | Disposition: A | Discharge status: Home / Self Care | Payer: BLUE CROSS/BLUE SHIELD | Attending: Emergency Medicine | Admitting: Emergency Medicine

## 2014-10-07 ENCOUNTER — Encounter (HOSPITAL_COMMUNITY): Payer: Self-pay | Admitting: Emergency Medicine

## 2014-10-07 DIAGNOSIS — Z791 Long term (current) use of non-steroidal anti-inflammatories (NSAID): Secondary | ICD-10-CM | POA: Insufficient documentation

## 2014-10-07 DIAGNOSIS — J0100 Acute maxillary sinusitis, unspecified: Secondary | ICD-10-CM | POA: Insufficient documentation

## 2014-10-07 DIAGNOSIS — I10 Essential (primary) hypertension: Secondary | ICD-10-CM | POA: Insufficient documentation

## 2014-10-07 DIAGNOSIS — Z79899 Other long term (current) drug therapy: Secondary | ICD-10-CM | POA: Insufficient documentation

## 2014-10-07 DIAGNOSIS — Z87891 Personal history of nicotine dependence: Secondary | ICD-10-CM | POA: Insufficient documentation

## 2014-10-07 MED ORDER — AZITHROMYCIN 250 MG PO TABS: 250.0000 mg | ORAL_TABLET | Freq: Every day | ORAL | Status: DC

## 2014-10-07 MED ORDER — TRIAMCINOLONE ACETONIDE 55 MCG/ACT NA AERO: 2.0000 | INHALATION_SPRAY | Freq: Every day | NASAL | Status: DC

## 2014-10-07 MED ORDER — BENZONATATE 100 MG PO CAPS: 100.0000 mg | ORAL_CAPSULE | Freq: Three times a day (TID) | ORAL | Status: DC

## 2014-10-07 NOTE — ED Notes (Signed)
Has been taking nyquil dayquil and tylenol cold and flu.

## 2014-10-07 NOTE — ED Notes (Signed)
Pt reports nasal congestion/cough x1 week. Reports a headache that feels "like it's behind my eye" that started after the congestion symptoms started. Reports coughing up mucous-unsure of color of mucous. No other c/c. RR even/unlabored.
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
## 2014-10-07 NOTE — Discharge Instructions (Signed)

## 2015-04-21 ENCOUNTER — Emergency Department (HOSPITAL_COMMUNITY)
Admission: EM | Admit: 2015-04-21 | Discharge: 2015-04-21 | Disposition: A | Discharge status: Home / Self Care | Payer: Commercial Managed Care - PPO | Attending: Emergency Medicine | Admitting: Emergency Medicine

## 2015-04-21 ENCOUNTER — Encounter (HOSPITAL_COMMUNITY): Payer: Self-pay | Admitting: Emergency Medicine

## 2015-04-21 DIAGNOSIS — H109 Unspecified conjunctivitis: Secondary | ICD-10-CM | POA: Insufficient documentation

## 2015-04-21 DIAGNOSIS — I10 Essential (primary) hypertension: Secondary | ICD-10-CM | POA: Diagnosis not present

## 2015-04-21 DIAGNOSIS — F17210 Nicotine dependence, cigarettes, uncomplicated: Secondary | ICD-10-CM | POA: Insufficient documentation

## 2015-04-21 DIAGNOSIS — H5712 Ocular pain, left eye: Secondary | ICD-10-CM | POA: Diagnosis present

## 2015-04-21 MED ORDER — ERYTHROMYCIN 5 MG/GM OP OINT
TOPICAL_OINTMENT | Freq: Once | OPHTHALMIC | Status: AC
  Administered 2015-04-21: 12:00:00 via OPHTHALMIC
  Filled 2015-04-21: qty 3.5

## 2015-04-21 MED ORDER — FLUORESCEIN SODIUM 1 MG OP STRP
1.0000 | ORAL_STRIP | Freq: Once | OPHTHALMIC | Status: AC
  Administered 2015-04-21: 1 via OPHTHALMIC
  Filled 2015-04-21: qty 1

## 2015-04-21 MED ORDER — TETRACAINE HCL 0.5 % OP SOLN
1.0000 [drp] | Freq: Once | OPHTHALMIC | Status: AC
  Administered 2015-04-21: 1 [drp] via OPHTHALMIC
  Filled 2015-04-21: qty 4
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
## 2015-04-21 NOTE — Discharge Instructions (Signed)
Bacterial Conjunctivitis Use erythromycin every 6 hours. Follow-up with ophthalmology tomorrow. Bacterial conjunctivitis (commonly called pink eye) is redness, soreness, or puffiness (inflammation) of the white part of your eye. It is caused by a germ called bacteria. These germs can easily spread from person to person (contagious). Your eye often will become red or pink. Your eye may also become irritated, watery, or have a thick discharge.  HOME CARE   Apply a cool, clean washcloth over closed eyelids. Do this for 10-20 minutes, 3-4 times a day while you have pain.  Gently wipe away any fluid coming from the eye with a warm, wet washcloth or cotton ball.  Wash your hands often with soap and water. Use paper towels to dry your hands.  Do not share towels or washcloths.  Change or wash your pillowcase every day.  Do not use eye makeup until the infection is gone.  Do not use machines or drive if your vision is blurry.  Stop using contact lenses. Do not use them again until your doctor says it is okay.  Do not touch the tip of the eye drop bottle or medicine tube with your fingers when you put medicine on the eye. GET HELP RIGHT AWAY IF:   Your eye is not better after 3 days of starting your medicine.  You have a yellowish fluid coming out of the eye.  You have more pain in the eye.  Your eye redness is spreading.  Your vision becomes blurry.  You have a fever or lasting symptoms for more than 2-3 days.  You have a fever and your symptoms suddenly get worse.  You have pain in the face.  Your face gets red or puffy (swollen). MAKE SURE YOU:   Understand these instructions.  Will watch this condition.  Will get help right away if you are not doing well or get worse.   This information is not intended to replace advice given to you by your health care provider. Make sure you discuss any questions you have with your health care provider.   Document Released: 10/13/2007  Document Revised: 12/21/2011 Document Reviewed: 09/09/2011 Elsevier Interactive Patient Education [employer]2016 [employer] Inc.

## 2015-04-21 NOTE — ED Notes (Signed)
Bed: WA01 Expected date:  Expected time:  Means of arrival:  Comments: Fast track

## 2015-04-21 NOTE — ED Notes (Signed)
Pt reports acute, stabbing pain in l/eye x 4 hours. Denies trauma. Eye slightly red, no drainage noted

## 2019-05-20 ENCOUNTER — Emergency Department (HOSPITAL_COMMUNITY): Payer: Self-pay

## 2019-05-20 ENCOUNTER — Other Ambulatory Visit: Payer: Self-pay

## 2019-05-20 ENCOUNTER — Encounter (HOSPITAL_COMMUNITY): Payer: Self-pay

## 2019-05-20 ENCOUNTER — Emergency Department (HOSPITAL_COMMUNITY)
Admission: EM | Admit: 2019-05-20 | Discharge: 2019-05-20 | Disposition: A | Discharge status: Home / Self Care | Payer: Self-pay | Attending: Emergency Medicine | Admitting: Emergency Medicine

## 2019-05-20 DIAGNOSIS — I10 Essential (primary) hypertension: Secondary | ICD-10-CM | POA: Insufficient documentation

## 2019-05-20 DIAGNOSIS — R109 Unspecified abdominal pain: Secondary | ICD-10-CM

## 2019-05-20 DIAGNOSIS — Z87891 Personal history of nicotine dependence: Secondary | ICD-10-CM | POA: Insufficient documentation

## 2019-05-20 DIAGNOSIS — R1032 Left lower quadrant pain: Secondary | ICD-10-CM | POA: Insufficient documentation

## 2019-05-20 DIAGNOSIS — Z79899 Other long term (current) drug therapy: Secondary | ICD-10-CM | POA: Insufficient documentation

## 2019-05-20 LAB — COMPREHENSIVE METABOLIC PANEL
ALT: 15 U/L (ref 0–44)
AST: 19 U/L (ref 15–41)
Albumin: 4.1 g/dL (ref 3.5–5.0)
Alkaline Phosphatase: 50 U/L (ref 38–126)
Anion gap: 6 (ref 5–15)
BUN: 14 mg/dL (ref 6–20)
CO2: 24 mmol/L (ref 22–32)
Calcium: 9 mg/dL (ref 8.9–10.3)
Chloride: 110 mmol/L (ref 98–111)
Creatinine, Ser: 0.9 mg/dL (ref 0.44–1.00)
GFR calc Af Amer: >60 mL/min (ref >60)
GFR calc non Af Amer: >60 mL/min (ref >60)
Glucose, Bld: 90 mg/dL (ref 70–99)
Potassium: 3.7 mmol/L (ref 3.5–5.1)
Sodium: 140 mmol/L (ref 135–145)
Total Bilirubin: 0.5 mg/dL (ref 0.3–1.2)
Total Protein: 6.9 g/dL (ref 6.5–8.1)

## 2019-05-20 LAB — CBC WITH DIFFERENTIAL/PLATELET
Abs Immature Granulocytes: 0.01 10*3/uL (ref 0.00–0.07)
Basophils Absolute: 0.1 10*3/uL (ref 0.0–0.1)
Basophils Relative: 1 %
Eosinophils Absolute: 0.1 10*3/uL (ref 0.0–0.5)
Eosinophils Relative: 1 %
HCT: 38.9 % (ref 36.0–46.0)
Hemoglobin: 13 g/dL (ref 12.0–15.0)
Immature Granulocytes: 0 %
Lymphocytes Relative: 45 %
Lymphs Abs: 3.2 10*3/uL (ref 0.7–4.0)
MCH: 31.6 pg (ref 26.0–34.0)
MCHC: 33.4 g/dL (ref 30.0–36.0)
MCV: 94.6 fL (ref 80.0–100.0)
Monocytes Absolute: 0.6 10*3/uL (ref 0.1–1.0)
Monocytes Relative: 9 %
Neutro Abs: 3.2 10*3/uL (ref 1.7–7.7)
Neutrophils Relative %: 44 %
Platelets: 245 10*3/uL (ref 150–400)
RBC: 4.11 MIL/uL (ref 3.87–5.11)
RDW: 15.1 % (ref 11.5–15.5)
WBC: 7.2 10*3/uL (ref 4.0–10.5)
nRBC: 0 % (ref 0.0–0.2)

## 2019-05-20 LAB — URINALYSIS, ROUTINE W REFLEX MICROSCOPIC
Bacteria, UA: NONE SEEN
Bilirubin Urine: NEGATIVE
Glucose, UA: NEGATIVE mg/dL
Ketones, ur: NEGATIVE mg/dL
Leukocytes,Ua: NEGATIVE
Nitrite: NEGATIVE
Protein, ur: NEGATIVE mg/dL
Specific Gravity, Urine: 1.017 (ref 1.005–1.030)
pH: 6 (ref 5.0–8.0)

## 2019-05-20 LAB — I-STAT BETA HCG BLOOD, ED (MC, WL, AP ONLY): I-stat hCG, quantitative: <5 m[IU]/mL (ref <5)

## 2019-05-20 LAB — LIPASE, BLOOD: Lipase: 31 U/L (ref 11–51)

## 2019-05-20 MED ORDER — FAMOTIDINE IN NACL 20-0.9 MG/50ML-% IV SOLN
20.0000 mg | Freq: Once | INTRAVENOUS | Status: AC
  Administered 2019-05-20: 18:00:00 20 mg via INTRAVENOUS
  Filled 2019-05-20: qty 50

## 2019-05-20 MED ORDER — SODIUM CHLORIDE (PF) 0.9 % IJ SOLN
INTRAMUSCULAR | Status: AC
  Filled 2019-05-20: qty 50

## 2019-05-20 MED ORDER — HYDROMORPHONE HCL 1 MG/ML IJ SOLN
1.0000 mg | Freq: Once | INTRAMUSCULAR | Status: AC
  Administered 2019-05-20: 18:00:00 1 mg via INTRAVENOUS
  Filled 2019-05-20: qty 1

## 2019-05-20 MED ORDER — PANTOPRAZOLE SODIUM 20 MG PO TBEC
20.0000 mg | DELAYED_RELEASE_TABLET | Freq: Two times a day (BID) | ORAL | 0 refills | Status: DC
Start: 2019-05-20 — End: 2021-11-15

## 2019-05-20 MED ORDER — LIDOCAINE VISCOUS HCL 2 % MT SOLN
15.0000 mL | Freq: Once | OROMUCOSAL | Status: AC
  Administered 2019-05-20: 18:00:00 15 mL via ORAL
  Filled 2019-05-20: qty 15

## 2019-05-20 MED ORDER — BUTALBITAL-APAP-CAFFEINE 50-325-40 MG PO TABS
1.0000 | ORAL_TABLET | ORAL | 0 refills | Status: AC | PRN
Start: 2019-05-20 — End: 2020-05-19

## 2019-05-20 MED ORDER — IOHEXOL 300 MG/ML  SOLN
100.0000 mL | Freq: Once | INTRAMUSCULAR | Status: AC | PRN
  Administered 2019-05-20: 19:00:00 100 mL via INTRAVENOUS

## 2019-05-20 MED ORDER — ALUM & MAG HYDROXIDE-SIMETH 200-200-20 MG/5ML PO SUSP
30.0000 mL | Freq: Once | ORAL | Status: AC
  Administered 2019-05-20: 18:00:00 30 mL via ORAL
  Filled 2019-05-20: qty 30

## 2019-05-20 NOTE — ED Triage Notes (Signed)
patient c/o left flank pain that radiates into the left back and down to the left groin area. Patient states the pain started 2 months ago and has been getting worse recently. Patient c/o urinating frequent small amounts.

## 2019-05-20 NOTE — ED Notes (Signed)
Pt transported to CT ?

## 2019-05-20 NOTE — ED Notes (Signed)
Patient has a urine culture in the main lab 
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
## 2019-05-20 NOTE — ED Notes (Signed)
Pt given maalox and viscous lidocaine but unable to keep it down. Pt had episode of emesis immediately after taking the medications.

## 2019-05-20 NOTE — Discharge Instructions (Addendum)
Stop taking NSAIDs (ibuprofen, aleve, excedrin, naprosyn, etc). You are likely irritating your stomach with how much you are taking. Your CT scan today does not show anything concerning. I am prescribing you something to take for your stomach and also something to take for your headaches instead of the excedrin. I expect your symptoms will begin to improve in the next 1-2 weeks.

## 2020-07-01 ENCOUNTER — Ambulatory Visit
Admission: EM | Admit: 2020-07-01 | Discharge: 2020-07-01 | Disposition: A | Discharge status: Home / Self Care | Payer: 59 | Attending: Emergency Medicine | Admitting: Emergency Medicine

## 2020-07-01 ENCOUNTER — Other Ambulatory Visit: Payer: Self-pay

## 2020-07-01 DIAGNOSIS — L239 Allergic contact dermatitis, unspecified cause: Secondary | ICD-10-CM | POA: Diagnosis not present

## 2020-07-01 LAB — POCT FASTING CBG KUC MANUAL ENTRY: POCT Glucose (KUC): 88 mg/dL (ref 70–99)

## 2020-07-01 MED ORDER — METHYLPREDNISOLONE SODIUM SUCC 125 MG IJ SOLR
125.0000 mg | Freq: Once | INTRAMUSCULAR | Status: AC
  Administered 2020-07-01: 125 mg via INTRAMUSCULAR

## 2020-07-01 MED ORDER — CETIRIZINE HCL 10 MG PO CAPS: 10.0000 mg | ORAL_CAPSULE | Freq: Every day | ORAL | 0 refills | Status: DC

## 2020-07-01 MED ORDER — PREDNISONE 10 MG PO TABS
ORAL_TABLET | ORAL | 0 refills | Status: DC
Start: 2020-07-01 — End: 2021-09-15

## 2020-07-01 NOTE — ED Triage Notes (Signed)
Onset yesterday of pruritic rash on her trunk, back and bilateral thighs that has worsened since the onset. Has taken oral and topical benadryl without relief. No new foods and known triggers. Has changed laundry detergents.
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
## 2020-07-01 NOTE — Discharge Instructions (Addendum)
Blood sugar today - 88 (normal) Begin prednisone taper x6 days-begin with 6 tablets on day 1, decrease by 1 tablet each day until complete-6, 5, 4, 3, 2, 1-take with food and earlier in the day if possible May also use daily Zyrtec or Claritin in the morning, Benadryl in the evening Monitor for rash to resolve over the next 5 to 7 days Follow-up if not improving or worsening

## 2020-07-08 ENCOUNTER — Other Ambulatory Visit: Payer: Self-pay | Admitting: Family Medicine

## 2020-07-08 DIAGNOSIS — R109 Unspecified abdominal pain: Secondary | ICD-10-CM

## 2020-07-31 ENCOUNTER — Other Ambulatory Visit: Payer: Self-pay

## 2020-07-31 ENCOUNTER — Ambulatory Visit
Admission: RE | Admit: 2020-07-31 | Discharge: 2020-07-31 | Disposition: A | Discharge status: Home / Self Care | Payer: 59 | Source: Ambulatory Visit | Attending: Family Medicine | Admitting: Family Medicine

## 2020-07-31 DIAGNOSIS — R109 Unspecified abdominal pain: Secondary | ICD-10-CM

## 2020-11-16 ENCOUNTER — Ambulatory Visit
Admission: RE | Admit: 2020-11-16 | Discharge: 2020-11-16 | Disposition: A | Discharge status: Home / Self Care | Payer: 59 | Source: Ambulatory Visit | Attending: Family Medicine | Admitting: Family Medicine

## 2020-11-16 ENCOUNTER — Other Ambulatory Visit: Payer: Self-pay | Admitting: Family Medicine

## 2020-11-16 DIAGNOSIS — G8929 Other chronic pain: Secondary | ICD-10-CM

## 2020-11-16 DIAGNOSIS — M545 Low back pain, unspecified: Secondary | ICD-10-CM

## 2021-02-25 ENCOUNTER — Ambulatory Visit: Payer: 59 | Admitting: Surgery

## 2021-03-04 ENCOUNTER — Ambulatory Visit: Payer: Self-pay

## 2021-03-04 ENCOUNTER — Ambulatory Visit (INDEPENDENT_AMBULATORY_CARE_PROVIDER_SITE_OTHER): Payer: 59 | Admitting: Surgery

## 2021-03-04 ENCOUNTER — Other Ambulatory Visit: Payer: Self-pay

## 2021-03-04 ENCOUNTER — Encounter: Payer: Self-pay | Admitting: Surgery

## 2021-03-04 VITALS — BP 150/99 | HR 67 | Ht 73.0 in | Wt 300.0 lb

## 2021-03-04 DIAGNOSIS — M542 Cervicalgia: Secondary | ICD-10-CM | POA: Diagnosis not present

## 2021-03-04 DIAGNOSIS — M25511 Pain in right shoulder: Secondary | ICD-10-CM | POA: Diagnosis not present

## 2021-03-04 DIAGNOSIS — M5416 Radiculopathy, lumbar region: Secondary | ICD-10-CM | POA: Diagnosis not present

## 2021-03-04 DIAGNOSIS — M7541 Impingement syndrome of right shoulder: Secondary | ICD-10-CM
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
## 2021-03-05 ENCOUNTER — Telehealth: Payer: Self-pay | Admitting: Orthopedic Surgery

## 2021-03-05 NOTE — Telephone Encounter (Signed)
Pt called. States her shoulder is in quite a bit

## 2021-03-17 ENCOUNTER — Encounter: Payer: Self-pay | Admitting: Orthopedic Surgery

## 2021-03-17 ENCOUNTER — Other Ambulatory Visit: Payer: Self-pay

## 2021-03-17 ENCOUNTER — Ambulatory Visit (INDEPENDENT_AMBULATORY_CARE_PROVIDER_SITE_OTHER): Payer: 59 | Admitting: Orthopedic Surgery

## 2021-03-17 DIAGNOSIS — M25511 Pain in right shoulder: Secondary | ICD-10-CM
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
## 2021-03-18 NOTE — Addendum Note (Signed)
Addended by: Rip Harbour on: 03/18/2021 04:08 PM ? ? Modules accepted: Orders ? ?

## 2021-03-24 ENCOUNTER — Encounter: Payer: Self-pay | Admitting: Surgery

## 2021-03-30 ENCOUNTER — Ambulatory Visit: Payer: 59 | Admitting: Orthopaedic Surgery

## 2021-03-30 ENCOUNTER — Other Ambulatory Visit: Payer: Self-pay

## 2021-04-01 ENCOUNTER — Ambulatory Visit (HOSPITAL_COMMUNITY): Admission: RE | Admit: 2021-04-01 | Payer: 59 | Source: Ambulatory Visit

## 2021-04-07 ENCOUNTER — Ambulatory Visit (HOSPITAL_COMMUNITY): Payer: 59

## 2021-04-07 ENCOUNTER — Ambulatory Visit (HOSPITAL_COMMUNITY): Admission: RE | Admit: 2021-04-07 | Payer: 59 | Source: Ambulatory Visit

## 2021-04-07 ENCOUNTER — Encounter (HOSPITAL_COMMUNITY): Payer: Self-pay

## 2021-04-08 ENCOUNTER — Telehealth: Payer: Self-pay | Admitting: Orthopaedic Surgery

## 2021-04-13 ENCOUNTER — Ambulatory Visit (HOSPITAL_COMMUNITY): Payer: 59

## 2021-04-14 ENCOUNTER — Ambulatory Visit (HOSPITAL_COMMUNITY): Admission: RE | Admit: 2021-04-14 | Payer: 59 | Source: Ambulatory Visit

## 2021-04-26 ENCOUNTER — Ambulatory Visit (HOSPITAL_COMMUNITY): Admission: RE | Admit: 2021-04-26 | Payer: 59 | Source: Ambulatory Visit

## 2021-04-28 ENCOUNTER — Ambulatory Visit (HOSPITAL_COMMUNITY): Admission: RE | Admit: 2021-04-28 | Payer: 59 | Source: Ambulatory Visit

## 2021-04-28 ENCOUNTER — Inpatient Hospital Stay (HOSPITAL_COMMUNITY): Admission: RE | Admit: 2021-04-28 | Payer: 59 | Source: Ambulatory Visit

## 2021-05-31 NOTE — Telephone Encounter (Signed)
error 

## 2021-09-15 ENCOUNTER — Emergency Department (HOSPITAL_BASED_OUTPATIENT_CLINIC_OR_DEPARTMENT_OTHER)
Admission: EM | Admit: 2021-09-15 | Discharge: 2021-09-15 | Disposition: A | Discharge status: Home / Self Care | Payer: Commercial Managed Care - HMO | Attending: Emergency Medicine | Admitting: Emergency Medicine

## 2021-09-15 ENCOUNTER — Other Ambulatory Visit: Payer: Self-pay

## 2021-09-15 ENCOUNTER — Emergency Department (HOSPITAL_BASED_OUTPATIENT_CLINIC_OR_DEPARTMENT_OTHER): Payer: Commercial Managed Care - HMO | Admitting: Radiology

## 2021-09-15 ENCOUNTER — Encounter (HOSPITAL_BASED_OUTPATIENT_CLINIC_OR_DEPARTMENT_OTHER): Payer: Self-pay

## 2021-09-15 DIAGNOSIS — Y92410 Unspecified street and highway as the place of occurrence of the external cause: Secondary | ICD-10-CM | POA: Diagnosis not present

## 2021-09-15 DIAGNOSIS — M546 Pain in thoracic spine: Secondary | ICD-10-CM | POA: Insufficient documentation

## 2021-09-15 DIAGNOSIS — M545 Low back pain, unspecified: Secondary | ICD-10-CM | POA: Insufficient documentation

## 2021-09-15 LAB — HCG, QUANTITATIVE, PREGNANCY: hCG, Beta Chain, Quant, S: <5 m[IU]/mL (ref <5)

## 2021-09-15 MED ORDER — DICLOFENAC SODIUM 75 MG PO TBEC: 75.0000 mg | DELAYED_RELEASE_TABLET | Freq: Two times a day (BID) | ORAL | 0 refills | Status: DC

## 2021-09-15 MED ORDER — METHOCARBAMOL 500 MG PO TABS: 500.0000 mg | ORAL_TABLET | Freq: Four times a day (QID) | ORAL | 0 refills | Status: DC
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
## 2021-09-15 NOTE — ED Notes (Signed)
Late entry -- Pt has returned from Xray - ambulatory independently to and from bathroom after return - no acute changes

## 2021-09-15 NOTE — ED Notes (Signed)
Pt ambulatory to Radiology escorted by [facility]

## 2021-09-15 NOTE — ED Notes (Signed)
Pt awake and alert; GCS 15.  No obvious neuro deficits - reports ongoing generalized back pain radiating down RLE described as aching/pressure with intermittent spasms rated 10/10- pt denies loss of bowel/bladder.  Denies parasthesias.  Awaits L-Spine and T-Spine xrays

## 2021-09-15 NOTE — Discharge Instructions (Addendum)
Follow up with your Physician for recheck  
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
## 2021-11-15 ENCOUNTER — Ambulatory Visit (INDEPENDENT_AMBULATORY_CARE_PROVIDER_SITE_OTHER): Payer: Commercial Managed Care - HMO | Admitting: Podiatry

## 2021-11-15 ENCOUNTER — Ambulatory Visit (INDEPENDENT_AMBULATORY_CARE_PROVIDER_SITE_OTHER): Payer: Commercial Managed Care - HMO

## 2021-11-15 ENCOUNTER — Encounter: Payer: Self-pay | Admitting: Podiatry

## 2021-11-15 DIAGNOSIS — M76821 Posterior tibial tendinitis, right leg: Secondary | ICD-10-CM | POA: Diagnosis not present

## 2021-11-15 DIAGNOSIS — M76822 Posterior tibial tendinitis, left leg: Secondary | ICD-10-CM | POA: Diagnosis not present

## 2021-11-15 DIAGNOSIS — M722 Plantar fascial fibromatosis: Secondary | ICD-10-CM | POA: Diagnosis not present

## 2021-11-15 MED ORDER — TRIAMCINOLONE ACETONIDE 10 MG/ML IJ SUSP
20.0000 mg | Freq: Once | INTRAMUSCULAR | Status: AC
  Administered 2021-11-15: 20 mg

## 2021-11-15 MED ORDER — DICLOFENAC SODIUM 75 MG PO TBEC: 75.0000 mg | DELAYED_RELEASE_TABLET | Freq: Two times a day (BID) | ORAL | 2 refills | Status: AC

## 2021-11-15 NOTE — Progress Notes (Signed)
Subjective:   Patient ID: Nancy Jarvis, female   DOB: 36 y.o.   MRN: [id number]   HPI Patient presents with a lot of pain in the arch and inside of the ankle left over right.  She has a new job and is standing more and states the pain is gotten worse and has had a history of fasciitis-like symptoms.  Patient states that she is having trouble working with the pain does not currently smoke likes to be active   Review of Systems  All other systems reviewed and are negative.       Objective:  Physical Exam Vitals and nursing note reviewed.  Constitutional:      Appearance: She is well-developed.  Pulmonary:     Effort: Pulmonary effort is normal.  Musculoskeletal:        General: Normal range of motion.  Skin:    General: Skin is warm.  Neurological:     Mental Status: She is alert.     Neurovascular status found to be intact with patient noted to have inflammation pain of the medial side of the ankle bilateral around the posterior tibial tendon left over right.  The patient does not have an indication of muscle strength loss does have severe flatfoot deformity which is contributory to the problem and was found to have good digital perfusion well-oriented x3     Assessment:  Acute posterior tibial tendinitis bilateral with inflammation fluid buildup along with flatfoot deformity     Plan:  H&P reviewed condition and at this point I did recommend sheath injections and I went ahead did sterile prep and injected the sheath of the posterior tibial tendon as it comes under the medial malleolus 3 mg dexamethasone Kenalog 5 mg Xylocaine and applied fascial braces to lift up the arches along with supportive shoes and over-the-counter insoles.  May require permanent insoles or other treatment depending on response to conservative and may require MRI if symptoms persi and I did explain risk prior to giving the injections  X-rays indicate flatfoot deformity bilateral with moderate changes  around the subtalar joint bilateral

## 2021-12-06 ENCOUNTER — Encounter: Payer: Self-pay | Admitting: Podiatry

## 2021-12-06 ENCOUNTER — Ambulatory Visit (INDEPENDENT_AMBULATORY_CARE_PROVIDER_SITE_OTHER): Payer: Commercial Managed Care - HMO | Admitting: Podiatry

## 2021-12-06 DIAGNOSIS — M76821 Posterior tibial tendinitis, right leg: Secondary | ICD-10-CM

## 2021-12-06 DIAGNOSIS — M76822 Posterior tibial tendinitis, left leg: Secondary | ICD-10-CM | POA: Diagnosis not present

## 2021-12-06 MED ORDER — DICLOFENAC SODIUM 75 MG PO TBEC: 75.0000 mg | DELAYED_RELEASE_TABLET | Freq: Two times a day (BID) | ORAL | 2 refills | Status: AC

## 2021-12-07 NOTE — Progress Notes (Signed)
Subjective:   Patient ID: Nancy Jarvis, female   DOB: 36 y.o.   MRN: [id number]   HPI Patient states improved but still having a lot of pain in the inside of both ankles   ROS      Objective:  Physical Exam  Neuro vas ocular status intact with flatfoot deformity noted bilateral and chronic inflammation pain of the posterior tibial tendon as it comes under the medial malleolus bilateral     Assessment:  Posterior tibial tendinitis bilateral with inflammation pain and flatfoot deformity bilateral     Plan:  H&P reviewed condition and discussed this foot structure and flatfoot deformity present and went ahead today and discussed shoe gear modifications and orthotics patient wants orthotics and is casted for functional orthotic devices at this time and will continue to use braces until those are returned along with supportive shoe gear

## 2022-01-06 ENCOUNTER — Telehealth: Payer: Self-pay | Admitting: Podiatry

## 2022-01-06 NOTE — Telephone Encounter (Signed)
Left message on vm to schedule appt to come try on orthotics   Balance  110.26

## 2022-01-12 NOTE — Telephone Encounter (Signed)
Pt scheduled 01/20/22

## 2022-01-20 ENCOUNTER — Other Ambulatory Visit: Payer: Commercial Managed Care - HMO

## 2022-01-20 NOTE — Telephone Encounter (Signed)
Left message on vm to schedule appt to come try on orthotics

## 2022-02-03 ENCOUNTER — Other Ambulatory Visit: Payer: Commercial Managed Care - HMO

## 2022-02-10 ENCOUNTER — Other Ambulatory Visit: Payer: Commercial Managed Care - HMO

## 2022-02-25 ENCOUNTER — Other Ambulatory Visit: Payer: Self-pay | Admitting: Podiatry

## 2022-02-28 ENCOUNTER — Other Ambulatory Visit: Payer: Commercial Managed Care - HMO

## 2022-03-04 ENCOUNTER — Other Ambulatory Visit: Payer: Commercial Managed Care - HMO

## 2022-03-14 ENCOUNTER — Ambulatory Visit (INDEPENDENT_AMBULATORY_CARE_PROVIDER_SITE_OTHER): Payer: Commercial Managed Care - HMO

## 2022-03-14 DIAGNOSIS — M76822 Posterior tibial tendinitis, left leg: Secondary | ICD-10-CM

## 2022-03-14 DIAGNOSIS — M722 Plantar fascial fibromatosis: Secondary | ICD-10-CM

## 2022-03-14 DIAGNOSIS — M76821 Posterior tibial tendinitis, right leg: Secondary | ICD-10-CM

## 2022-03-18 ENCOUNTER — Other Ambulatory Visit: Payer: Self-pay | Admitting: Podiatry

## 2022-03-23 ENCOUNTER — Other Ambulatory Visit: Payer: Self-pay | Admitting: Podiatry

## 2022-03-25 NOTE — Progress Notes (Signed)
Patient presents today to pick up custom molded foot orthotics recommended by Dr. REGAL.   Orthotics were dispensed and fit was satisfactory. Reviewed instructions for break-in and wear. Written instructions given to patient.  Patient will follow up as needed.
